# Patient Record
Sex: Male | Born: 1970 | Race: White | Hispanic: No | Marital: Married
Health system: Northeastern US, Academic
[De-identification: ages and names within clinical notes are randomized; demographics above are authoritative.]

## PROBLEM LIST (undated history)

## (undated) DIAGNOSIS — R519 Headache, unspecified: Secondary | ICD-10-CM

---

## 2017-10-26 ENCOUNTER — Ambulatory Visit

## 2019-03-07 ENCOUNTER — Ambulatory Visit

## 2019-04-23 ENCOUNTER — Ambulatory Visit

## 2019-07-17 ENCOUNTER — Ambulatory Visit

## 2020-06-23 ENCOUNTER — Ambulatory Visit

## 2020-06-23 NOTE — Telephone Encounter (Signed)
 Phone Note -       Initial call taken by: Renae Fickle,  June 23, 2020 11:42 AM  Initial Details of Call:  lvm with pt to confirm new pt appt with [facility] on 7/26

## 2020-06-23 NOTE — Telephone Encounter (Signed)
 Phone Note -     Patient    Routine    Initial call taken by: Virgina Norfolk ([facility]),  June 23, 2020 11:52 AM  Initial Details of Call:  Patient returned Hannah's call stating he was unaware of a GI appt and did not make an appt with G[facility] for 07/03/20 at 9AM.  Patient states new address is [address] [place] [address]    Patient called to cancel their appointment for: 9AM with Dr. Dollene Cleveland  Patient declined to reschedule.  Best call back number is: [phone]

## 2020-11-23 DIAGNOSIS — G4733 Obstructive sleep apnea (adult) (pediatric): Secondary | ICD-10-CM | POA: Diagnosis present

## 2020-11-23 DIAGNOSIS — G43909 Migraine, unspecified, not intractable, without status migrainosus: Secondary | ICD-10-CM | POA: Diagnosis present

## 2021-12-11 ENCOUNTER — Other Ambulatory Visit: Payer: Self-pay

## 2021-12-11 ENCOUNTER — Ambulatory Visit
Admission: EM | Admit: 2021-12-11 | Discharge: 2021-12-11 | Disposition: A | Discharge status: Home / Self Care | Payer: BC Managed Care – PPO

## 2021-12-11 DIAGNOSIS — R5383 Other fatigue: Secondary | ICD-10-CM

## 2021-12-11 DIAGNOSIS — R509 Fever, unspecified: Secondary | ICD-10-CM | POA: Diagnosis not present

## 2021-12-11 HISTORY — DX: Headache, unspecified: R51.9

## 2021-12-11 NOTE — ED Triage Notes (Addendum)
Pt states he got sick Dec 22nd. Had subjective fever. Then progressed to heartburn for a couple days which resolved, then a sore throat which resolved. States the subjective fever has remained. States he feels warm around 1-2 p.m and again in the evening. Last night was the first time he measured his temp and it was 99.9

## 2021-12-11 NOTE — ED Provider Notes (Signed)
[facility]    CSN: [id number] Arrival date & time: 12/11/21  1125      History   Chief Complaint Chief Complaint  Patient presents with   Fever    HPI Richard West is a 51 y.o. male presenting for approximately 1.5-week history of feeling unwell.  Patient reports symptoms initially included feeling overheated multiple times throughout the day.  Now he says it only happens a couple times throughout the day.  He believes these episodes where he feels very hot are associated with fever but he has not recorded a temperature until last night when it was 99.9 degrees.  He reports taking ibuprofen and Tylenol and says he feels better.  Patient says at 1 point he had a sore throat for couple of days.  He says that he had a runny nose for 1 day and reports coughing up sputum once.  States he feels a little short of breath at times but nothing significant.  Denies body aches.  Has been fatigued.  No chest pain, vomiting or diarrhea.  Patient says his daughter was sick with similar symptoms.  He has not been seen for symptoms until now.  Reports that he feels better from onset but still does not feel like himself.  No other complaints.  HPI  Past Medical History:  Diagnosis Date   Headache     There are no problems to display for this patient.   History reviewed. No pertinent surgical history.     Home Medications    Prior to Admission medications   Medication Sig Start Date End Date Taking? Authorizing Provider  metoprolol succinate (TOPROL-XL) 50 MG 24 hr tablet metoprolol succinate ER 50 mg tablet,extended release 24 hr  TAKE 2 TABLETS ONCE DAILY 10/24/17  Yes [provider]  Multiple Vitamin (MULTIVITAMIN WITH MINERALS) TABS tablet Take 1 tablet by mouth daily.   Yes [provider]    Family History History reviewed. No pertinent family history.  Social History Social History   Tobacco Use   Smoking status: Never   Smokeless tobacco: Never   Vaping Use   Vaping Use: Never used  Substance Use Topics   Alcohol use: Yes    Comment: social     Allergies   Patient has no known allergies.   Review of Systems Review of Systems  Constitutional:  Positive for fatigue and fever (subjective).  HENT:  Negative for congestion, rhinorrhea, sinus pressure, sinus pain and sore throat.   Respiratory:  Negative for cough and shortness of breath.   Cardiovascular:  Negative for chest pain.  Gastrointestinal:  Negative for abdominal pain, diarrhea, nausea and vomiting.  Genitourinary:  Negative for dysuria.  Musculoskeletal:  Negative for myalgias.  Neurological:  Negative for weakness, light-headedness and headaches.  Hematological:  Negative for adenopathy.    Physical Exam Triage Vital Signs ED Triage Vitals  Enc Vitals Group     BP 12/11/21 1241 (!) 149/71     Pulse Rate 12/11/21 1241 (!) 59     Resp 12/11/21 1241 17     Temp 12/11/21 1241 98.2 F (36.8 C)     Temp Source 12/11/21 1241 Oral     SpO2 12/11/21 1241 98 %     Weight 12/11/21 1243 255 lb (115.7 kg)     Height 12/11/21 1243 5\' 11"  (1.803 m)     Head Circumference --      Peak Flow --      Pain Score 12/11/21 1240 0  Pain Loc --      Pain Edu? --      Excl. in [place]? --    No data found.  Updated Vital Signs BP (!) 149/71 (BP Location: Right Arm)    Pulse (!) 59    Temp 98.2 F (36.8 C) (Oral)    Resp 17    Ht 5\' 11"  (1.803 m)    Wt 255 lb (115.7 kg)    SpO2 98%    BMI 35.57 kg/m      Physical Exam Vitals and nursing note reviewed.  Constitutional:      General: He is not in acute distress.    Appearance: Normal appearance. He is well-developed. He is not ill-appearing.  HENT:     Head: Normocephalic and atraumatic.     Nose: Nose normal.     Mouth/Throat:     Mouth: Mucous membranes are moist.     Pharynx: Oropharynx is clear. Posterior oropharyngeal erythema (mild) present.  Eyes:     General: No scleral icterus.    Conjunctiva/sclera:  Conjunctivae normal.  Cardiovascular:     Rate and Rhythm: Normal rate and regular rhythm.     Heart sounds: Normal heart sounds.  Pulmonary:     Effort: Pulmonary effort is normal. No respiratory distress.     Breath sounds: Normal breath sounds.  Musculoskeletal:     Cervical back: Neck supple.  Skin:    General: Skin is warm and dry.     Capillary Refill: Capillary refill takes less than 2 seconds.  Neurological:     General: No focal deficit present.     Mental Status: He is alert. Mental status is at baseline.     Motor: No weakness.     Coordination: Coordination normal.     Gait: Gait normal.  Psychiatric:        Mood and Affect: Mood normal.        Behavior: Behavior normal.        Thought Content: Thought content normal.     UC Treatments / Results  Labs (all labs ordered are listed, but only abnormal results are displayed) Labs Reviewed - No data to display  EKG   Radiology No results found.  Procedures Procedures (including critical care time)  Medications Ordered in UC Medications - No data to display  Initial Impression / Assessment and Plan / UC Course  I have reviewed the triage vital signs and the nursing notes.  Pertinent labs & imaging results that were available during my care of the patient were reviewed by me and considered in my medical decision making (see chart for details).  51 year old presenting for 1.5-week history of feeling unwell.  Reports hot flashes, getting overheated and fatigue.  Reports symptoms of sore throat, cough and congestion which have resolved.  Vitals all stable.  He is afebrile.  Overall well-appearing.  Mild posterior pharyngeal erythema, otherwise normal exam.  Advised patient that if he wanted to do some work-up we could do so to include strep, COVID-19 test and chest x-ray.  Patient declines.  He seems reassured by his exam findings.  Suspect patient probably has the flu or COVID or another viral illness and  symptoms are still lingering.  Patient would like to go home and continue to treat symptoms supportively.  He will return if symptoms are still ongoing after a week or if symptoms worsen.  ER precautions reviewed.   Final Clinical Impressions(s) / UC Diagnoses   Final diagnoses:  Subjective  fever  Other fatigue     Discharge Instructions      -Your symptoms are likely due to a viral illness.  Could have been the flu.  Related to test and it would not change management. - We did discuss testing for strep, performing a chest x-ray and checking for COVID given your continued symptoms but you have declined at this time. - Would advise you to increase rest and fluids and continue with Tylenol Motrin if you feel fevers. - If you develop a cough, chest pain, increased breathing difficulty, increased weakness, more diarrhea, have return of sore throat or you are not feeling better in another week, you need to be seen again and have some work-up performed.     ED Prescriptions   None    PDMP not reviewed this encounter.   Danton Clap, PA-C 12/11/21 1408

## 2021-12-11 NOTE — Discharge Instructions (Signed)
-  Your symptoms are likely due to a viral illness.  Could have been the flu.  Related to test and it would not change management. - We did discuss testing for strep, performing a chest x-ray and checking for COVID given your continued symptoms but you have declined at this time. - Would advise you to increase rest and fluids and continue with Tylenol Motrin if you feel fevers. - If you develop a cough, chest pain, increased breathing difficulty, increased weakness, more diarrhea, have return of sore throat or you are not feeling better in another week, you need to be seen again and have some work-up performed.

## 2021-12-12 ENCOUNTER — Emergency Department: Payer: BC Managed Care – PPO

## 2021-12-12 ENCOUNTER — Observation Stay
Admission: EM | Admit: 2021-12-12 | Discharge: 2021-12-13 | Disposition: A | Discharge status: Home / Self Care | Payer: BC Managed Care – PPO | Attending: Internal Medicine | Admitting: Internal Medicine

## 2021-12-12 ENCOUNTER — Encounter: Payer: Self-pay | Admitting: Emergency Medicine

## 2021-12-12 ENCOUNTER — Observation Stay: Payer: BC Managed Care – PPO

## 2021-12-12 ENCOUNTER — Other Ambulatory Visit: Payer: Self-pay

## 2021-12-12 DIAGNOSIS — Z20822 Contact with and (suspected) exposure to covid-19: Secondary | ICD-10-CM | POA: Insufficient documentation

## 2021-12-12 DIAGNOSIS — R200 Anesthesia of skin: Secondary | ICD-10-CM

## 2021-12-12 DIAGNOSIS — G43719 Chronic migraine without aura, intractable, without status migrainosus: Secondary | ICD-10-CM

## 2021-12-12 DIAGNOSIS — I639 Cerebral infarction, unspecified: Secondary | ICD-10-CM

## 2021-12-12 DIAGNOSIS — I10 Essential (primary) hypertension: Secondary | ICD-10-CM

## 2021-12-12 DIAGNOSIS — R7301 Impaired fasting glucose: Secondary | ICD-10-CM

## 2021-12-12 DIAGNOSIS — G43909 Migraine, unspecified, not intractable, without status migrainosus: Secondary | ICD-10-CM | POA: Diagnosis present

## 2021-12-12 DIAGNOSIS — R21 Rash and other nonspecific skin eruption: Secondary | ICD-10-CM

## 2021-12-12 DIAGNOSIS — R29818 Other symptoms and signs involving the nervous system: Secondary | ICD-10-CM | POA: Diagnosis not present

## 2021-12-12 DIAGNOSIS — G4733 Obstructive sleep apnea (adult) (pediatric): Secondary | ICD-10-CM | POA: Diagnosis present

## 2021-12-12 DIAGNOSIS — G459 Transient cerebral ischemic attack, unspecified: Secondary | ICD-10-CM

## 2021-12-12 DIAGNOSIS — Z79899 Other long term (current) drug therapy: Secondary | ICD-10-CM | POA: Diagnosis not present

## 2021-12-12 LAB — URINALYSIS, COMPLETE (UACMP) WITH MICROSCOPIC
Bacteria, UA: NONE SEEN
Bilirubin Urine: NEGATIVE
Glucose, UA: NEGATIVE mg/dL
Hgb urine dipstick: NEGATIVE
Ketones, ur: NEGATIVE mg/dL
Leukocytes,Ua: NEGATIVE
Nitrite: NEGATIVE
Protein, ur: NEGATIVE mg/dL
Specific Gravity, Urine: 1.02 (ref 1.005–1.030)
Squamous Epithelial / HPF: NONE SEEN (ref 0–5)
pH: 7 (ref 5.0–8.0)

## 2021-12-12 LAB — URINE DRUG SCREEN, QUALITATIVE (ARMC ONLY)
Amphetamines, Ur Screen: NOT DETECTED
Barbiturates, Ur Screen: NOT DETECTED
Benzodiazepine, Ur Scrn: NOT DETECTED
Cannabinoid 50 Ng, Ur ~~LOC~~: NOT DETECTED
Cocaine Metabolite,Ur ~~LOC~~: NOT DETECTED
MDMA (Ecstasy)Ur Screen: NOT DETECTED
Methadone Scn, Ur: NOT DETECTED
Opiate, Ur Screen: NOT DETECTED
Phencyclidine (PCP) Ur S: NOT DETECTED
Tricyclic, Ur Screen: NOT DETECTED

## 2021-12-12 LAB — CBC
HCT: 48.3 % (ref 39.0–52.0)
Hemoglobin: 16.7 g/dL (ref 13.0–17.0)
MCH: 34.6 pg — ABNORMAL HIGH (ref 26.0–34.0)
MCHC: 34.6 g/dL (ref 30.0–36.0)
MCV: 100 fL (ref 80.0–100.0)
Platelets: 290 10*3/uL (ref 150–400)
RBC: 4.83 MIL/uL (ref 4.22–5.81)
RDW: 12 % (ref 11.5–15.5)
WBC: 13.3 10*3/uL — ABNORMAL HIGH (ref 4.0–10.5)
nRBC: 0 % (ref 0.0–0.2)

## 2021-12-12 LAB — DIFFERENTIAL
Abs Immature Granulocytes: 0.11 10*3/uL — ABNORMAL HIGH (ref 0.00–0.07)
Basophils Absolute: 0.1 10*3/uL (ref 0.0–0.1)
Basophils Relative: 1 %
Eosinophils Absolute: 0.3 10*3/uL (ref 0.0–0.5)
Eosinophils Relative: 2 %
Immature Granulocytes: 1 %
Lymphocytes Relative: 20 %
Lymphs Abs: 2.6 10*3/uL (ref 0.7–4.0)
Monocytes Absolute: 0.9 10*3/uL (ref 0.1–1.0)
Monocytes Relative: 6 %
Neutro Abs: 9.4 10*3/uL — ABNORMAL HIGH (ref 1.7–7.7)
Neutrophils Relative %: 70 %

## 2021-12-12 LAB — COMPREHENSIVE METABOLIC PANEL
ALT: 34 U/L (ref 0–44)
AST: 30 U/L (ref 15–41)
Albumin: 3.7 g/dL (ref 3.5–5.0)
Alkaline Phosphatase: 55 U/L (ref 38–126)
Anion gap: 11 (ref 5–15)
BUN: 19 mg/dL (ref 6–20)
CO2: 27 mmol/L (ref 22–32)
Calcium: 9.1 mg/dL (ref 8.9–10.3)
Chloride: 101 mmol/L (ref 98–111)
Creatinine, Ser: 1.01 mg/dL (ref 0.61–1.24)
GFR, Estimated: >60 mL/min (ref >60)
Glucose, Bld: 147 mg/dL — ABNORMAL HIGH (ref 70–99)
Potassium: 3.7 mmol/L (ref 3.5–5.1)
Sodium: 139 mmol/L (ref 135–145)
Total Bilirubin: 0.7 mg/dL (ref 0.3–1.2)
Total Protein: 8 g/dL (ref 6.5–8.1)

## 2021-12-12 LAB — CBG MONITORING, ED: Glucose-Capillary: 152 mg/dL — ABNORMAL HIGH (ref 70–99)

## 2021-12-12 LAB — APTT: aPTT: 37 seconds — ABNORMAL HIGH (ref 24–36)

## 2021-12-12 LAB — PROTIME-INR
INR: 1.1 (ref 0.8–1.2)
Prothrombin Time: 14.2 seconds (ref 11.4–15.2)

## 2021-12-12 LAB — RESP PANEL BY RT-PCR (FLU A&B, COVID) ARPGX2
Influenza A by PCR: NEGATIVE
Influenza B by PCR: NEGATIVE
SARS Coronavirus 2 by RT PCR: NEGATIVE

## 2021-12-12 MED ORDER — ACETAMINOPHEN 325 MG PO TABS: 650.0000 mg | ORAL_TABLET | ORAL | Status: DC | PRN

## 2021-12-12 MED ORDER — ENOXAPARIN SODIUM 40 MG/0.4ML IJ SOSY
40.0000 mg | PREFILLED_SYRINGE | INTRAMUSCULAR | Status: DC
  Administered 2021-12-12: 40 mg via SUBCUTANEOUS
  Filled 2021-12-12: qty 0.4

## 2021-12-12 MED ORDER — ACETAMINOPHEN 160 MG/5ML PO SOLN
650.0000 mg | ORAL | Status: DC | PRN
  Filled 2021-12-12: qty 20.3

## 2021-12-12 MED ORDER — STROKE: EARLY STAGES OF RECOVERY BOOK: Freq: Once | Status: DC

## 2021-12-12 MED ORDER — ACETAMINOPHEN 650 MG RE SUPP: 650.0000 mg | RECTAL | Status: DC | PRN

## 2021-12-12 MED ORDER — SODIUM CHLORIDE 0.9% FLUSH
3.0000 mL | Freq: Once | INTRAVENOUS | Status: AC
  Administered 2021-12-12: 3 mL via INTRAVENOUS

## 2021-12-12 NOTE — ED Notes (Signed)
Pt to MRI

## 2021-12-12 NOTE — ED Provider Notes (Signed)
[facility] Provider Note    Event Date/Time   First MD Initiated Contact with Patient 12/12/21 2001     (approximate)   History   Code Stroke   HPI  Richard West is a 51 y.o. male who according to urgent care note dated from yesterday had been complaining of feeling unwell for roughly 1.5 weeks, presents to the emergency department because of left facial numbness. States he also had some difficulty with swallowing. Symptoms started roughly 1 hour prior to arrival. Additional history was obtained from family who stated they had noticed perhaps some slight change in his speech. The patient denies any acute weakness of his arms or legs. No vision changes. States he has history of headaches.     Physical Exam   Triage Vital Signs: ED Triage Vitals  Enc Vitals Group     BP 12/12/21 1935 (!) 163/85     Pulse Rate 12/12/21 1935 78     Resp 12/12/21 1935 18     Temp 12/12/21 1935 98.7 F (37.1 C)     Temp src --      SpO2 12/12/21 1935 96 %     Weight 12/12/21 1936 255 lb 1.2 oz (115.7 kg)     Height 12/12/21 1936 5\' 11"  (1.803 m)     Head Circumference --      Peak Flow --      Pain Score 12/12/21 1936 0   Most recent vital signs: Vitals:   12/12/21 2002 12/12/21 2018  BP: (!) 161/90 140/70  Pulse: 70 83  Resp: 20 (!) 26  Temp:    SpO2: 97% 99%    General: Awake, no distress.  CV:  Good peripheral perfusion.  Resp:  Normal effort.  Abd:  No distention.  Neuro:  Face symmetric. Tongue midline.     ED Results / Procedures / Treatments   Labs (all labs ordered are listed, but only abnormal results are displayed) Labs Reviewed  CBC - Abnormal; Notable for the following components:      Result Value   WBC 13.3 (*)    MCH 34.6 (*)    All other components within normal limits  DIFFERENTIAL - Abnormal; Notable for the following components:   Neutro Abs 9.4 (*)    Abs Immature Granulocytes 0.11 (*)    All other components within normal  limits  COMPREHENSIVE METABOLIC PANEL - Abnormal; Notable for the following components:   Glucose, Bld 147 (*)    All other components within normal limits  CBG MONITORING, ED - Abnormal; Notable for the following components:   Glucose-Capillary 152 (*)    All other components within normal limits  PROTIME-INR  APTT     EKG  I, Phineas Semen, attending physician, personally viewed and interpreted this EKG  EKG Time: 2000 Rate: 80 Rhythm: sinus rhythm Axis: right axis deviation Intervals: qtc 463 QRS: narrow ST changes: no st elevation Impression: abnormal ekg  RADIOLOGY CT head My interpretation: No bleed, no mass Radiology interpretation:  IMPRESSION:  1. Negative head CT.  No acute intracranial abnormality.  2. ASPECTS is 10.  3. Mild Chiari 1 malformation.      I, Phineas Semen, personally discussed these images and results by phone with the on-call radiologist and used this discussion as part of my medical decision making.     PROCEDURES:  Critical Care performed: No  Procedures   MEDICATIONS ORDERED IN ED: Medications  sodium chloride flush (NS) 0.9 % injection  3 mL (3 mLs Intravenous Given 12/12/21 1959)     IMPRESSION / MDM / ASSESSMENT AND PLAN / ED COURSE  I reviewed the triage vital signs and the nursing notes.                              Differential diagnosis includes, but is not limited to, Bell's palsy, complex migraine, CVA.  Patient presented to the emergency department today because of concerns for left facial numbness and some difficulty swallowing with potentially intermittent slurred speech.  Symptoms have been present for roughly 1 hour prior to arrival.  Patient was called a code stroke.  Neurologist did evaluate the patient did not feel he was a antithrombotic candidate at this time however does recommend admission for stroke/TIA work-up.      FINAL CLINICAL IMPRESSION(S) / ED DIAGNOSES   Final diagnoses:  Left facial  numbness     Note:  This document was prepared using Dragon voice recognition software and may include unintentional dictation errors.    Nance Pear, MD 12/13/21 [phone]

## 2021-12-12 NOTE — ED Notes (Addendum)
Pt back from CT - Tele neuro @ the bedside.

## 2021-12-12 NOTE — ED Notes (Signed)
Pt removed from t-shirt, jeans, and sneakers and placed into a hospital gown - his clothing was placed into belongings bags and given to his wife.

## 2021-12-12 NOTE — Consult Note (Signed)
[facility] TeleNeurology Consult Services   Patient Name:   Richard West, Richard West Date of Birth:   [birth year] Identification Number:   MRN - [id number] Date of Service:   12/12/2021 20:01:52  Diagnosis:       R29.810 - Facial numbness/ Facial weakness  Impression:      Patient presents to the hospital for transient left facial numbness/weakness and dysarthria which appears to have resolved. No focal disabling neurological deficits to warrant IV thrombolytics at this time. Differential includes migrainous phenomenon versus toxic metabolic etiology versus stroke/TIA. I would recommend he be admitted for TIA work-up. Neuro follow-up.  Metrics: Last Known Well: 12/12/2021 18:30:00 [facility] Notification Time: 12/12/2021 20:01:52 Arrival Time: 12/12/2021 19:27:00 Stamp Time: 12/12/2021 20:01:52 Initial Response Time: 12/12/2021 20:04:53 Symptoms: Facial numbness. NIHSS Start Assessment Time: 12/12/2021 20:07:48 Patient is not a candidate for Thrombolytic. Thrombolytic Medical Decision: 12/12/2021 20:07:47 Patient was not deemed candidate for Thrombolytic because of following reasons: No disabling symptoms.  I personally Reviewed the CT Head and it Showed no acute abnormalities  ED Physician notified of diagnostic impression and management plan on 12/12/2021 20:14:37  Advanced Imaging: Advanced Imaging Not Completed because:  Symptoms not consistent with LVO, NIHSS <6   Our recommendations are outlined below.  Recommendations:        Stroke/Telemetry Floor       Neuro Checks       Bedside Swallow Eval       DVT Prophylaxis       IV Fluids, Normal Saline       Head of Bed 30 Degrees       Euglycemia and Avoid Hyperthermia (PRN Acetaminophen)       Toxic/metabolic/infx workup per ED including UA, UDS       MRI brain without IV contrast       If no cerebral blood vessel imaging done in ED (such as CTA), recommend MRA head/neck without contrast, or carotid  ultrasound if cannot obtain MRA       TSH, A1c, lipid profile       Transthoracic echocardiogram       Continuous telemetry       Physical, occupational, and speech therapies       q4h neuro checks/NIHSS       NPO until bedside swallow       Neurology follow-up  Routine Consultation with [facility] for Follow up Care  Sign Out:       Discussed with Emergency Department Provider    ------------------------------------------------------------------------------  History of Present Illness: Patient is a 51 year old Male.  Patient was brought by private transportation with symptoms of Facial numbness. 52 year old male history of chronic migraine, hypertension, presents to the hospital for left facial numbness. Has been suffering from flulike symptoms for about a month now. Has been having some myalgias. Today was eating dinner and he began having some numbness on the left side of his face as well as in his throat. There was also concern for possible left facial droop and some dysarthria. His symptoms have essentially resolved at this time other than some mild tingling around the left side of the face near the mouth. No other deficits or symptoms. Never had a stroke or TIA before. Is having a headache although he has daily headaches from his chronic migraine. On exam he has no focal deficits right now, NIH stroke scale of 0.   Past Medical History:      Hypertension [place] PMH:  chronic migraine  Medications:  No Anticoagulant use  No Antiplatelet use Reviewed EMR for current medications  Allergies:  NKDA  Social History: Smoking: No Alcohol Use: No Drug Use: No  Family History:  There is no family history of premature cerebrovascular disease pertinent to this consultation  ROS : 14 Points Review of Systems was performed and was negative except mentioned in HPI.  Past Surgical History: There Is No Surgical History Contributory To Todays  Visit    Examination: BP(161/90), Pulse(69), Blood Glucose(152) 1A: Level of Consciousness - Alert; keenly responsive + 0 1B: Ask Month and Age - Both Questions Right + 0 1C: Blink Eyes & Squeeze Hands - Performs Both Tasks + 0 2: Test Horizontal Extraocular Movements - Normal + 0 3: Test Visual Fields - No Visual Loss + 0 4: Test Facial Palsy (Use Grimace if Obtunded) - Normal symmetry + 0 5A: Test Left Arm Motor Drift - No Drift for 10 Seconds + 0 5B: Test Right Arm Motor Drift - No Drift for 10 Seconds + 0 6A: Test Left Leg Motor Drift - No Drift for 5 Seconds + 0 6B: Test Right Leg Motor Drift - No Drift for 5 Seconds + 0 7: Test Limb Ataxia (FNF/Heel-Shin) - No Ataxia + 0 8: Test Sensation - Normal; No sensory loss + 0 9: Test Language/Aphasia - Normal; No aphasia + 0 10: Test Dysarthria - Normal + 0 11: Test Extinction/Inattention - No abnormality + 0  NIHSS Score: 0   Pre-Morbid Modified Rankin Scale: 0 Points = No symptoms at all   Patient/Family was informed the Neurology Consult would occur via TeleHealth consult by way of interactive audio and video telecommunications and consented to receiving care in this manner.   Patient is being evaluated for possible acute neurologic impairment and high probability of imminent or life-threatening deterioration. I spent total of 35 minutes providing care to this patient, including time for face to face visit via telemedicine, review of medical records, imaging studies and discussion of findings with providers, the patient and/or family.   Dr Knox Royalty   [facility] [phone]   Case [id number]

## 2021-12-12 NOTE — ED Triage Notes (Addendum)
Pt c/o left sided jaw numbness and his swallowing feels different that started about one hour ago. Pt also c/o BLE numbness x 1 week

## 2021-12-12 NOTE — H&P (Signed)
History and Physical    Richard West MVH:[id number] DOB: 07/27/1971 DOA: 12/12/2021  PCP: Pcp, No   Patient coming from: home  I have personally briefly reviewed patient's relevant [facility]  Chief Complaint: facial numbness  HPI: Richard Pickerric Derner is a 51 y.o. male with medical history significant for Chronic migraine who presented to the emergency room as a code stroke within 1 hour of the onset of left-sided facial numbness and weakness, dysarthria, trouble swallowing and slurred speech.  He denies any weakness or tingling in the extremities.  His symptoms had already started resolving by arrival with NIHSS of 0.  The episode was preceded by a 2-week history of flulike symptoms with low-grade temperature, sore throat and runny nose and a mild cough.  His daughter was also ill with similar symptoms.  He denies chest pain or shortness of breath.  States he noticed a rash on his left forearm that developed earlier in the day that does not itch or burn.  ED course: On arrival BP 163/85 with otherwise normal vitals Blood work with mild leukocytosis of 13,000 otherwise unremarkable  Head CT code stroke: Negative CT.  Mild Chiari I malformation  EKG, personally viewed and interpreted: Sinus rhythm at 80 with no acute ST-T wave changes  Teleneurology assessment: Differential includes migrainous phenomenon VS toxic metabolic etiology VS stroke/TIA.  Recommended admission for TIA work-up with neuro follow-up  Hospitalist consulted for admission.   Assessment/Plan    Transient neurologic deficit - Per teleneurology: Differential includes migrainous phenomenon VS toxic metabolic etiology VS stroke/TIA -Improving symptoms since onset but not tPA candidate due to low NIHSS - Stroke work-up to include MRI, continuous cardiac monitoring, echocardiogram and carotid Doppler - Bedside swallow eval, neurochecks - Toxic/metabolic/infection work-up to include UA, UDS, CXR, covid  19 and flu PCR - Follow TSH, A1c, lipid profile - PT OT and speech therapy eval - Neurology consult  Rash left forearm - Unclear etiology, does not appear shingles-like - Continue to monitor  Chronic migraine not intractable - Sumatriptan as needed - Patient gets Botox shots quarterly    Obstructive sleep apnea - CPAP if desired   DVT prophylaxis: Lovenox  Code Status: full code  Family Communication:  none  Disposition Plan: Back to previous home environment Consults called: none  Status: Observation   Review of Systems: As per HPI otherwise all other systems on review of systems negative.   Physical Exam: Vitals:   12/12/21 2018 12/12/21 2030 12/12/21 2100 12/12/21 2115  BP: 140/70 (!) 127/55 117/63 117/68  Pulse: 83 66 66 61  Resp: (!) 26 20 20  (!) 23  Temp:      SpO2: 99% 95% 96% 98%  Weight:      Height:       Constitutional: Alert, oriented x 3 . Not in any apparent distress HEENT:      Head: Normocephalic and atraumatic.         Eyes: PERLA, EOMI, Conjunctivae are normal. Sclera is non-icteric.       Mouth/Throat: Mucous membranes are moist.       Neck: Supple with no signs of meningismus. Cardiovascular: Regular rate and rhythm. No murmurs, gallops, or rubs. 2+ symmetrical distal pulses are present . No JVD. No  LE edema Respiratory: Respiratory effort normal .Lungs sounds clear bilaterally. No wheezes, crackles, or rhonchi.  Gastrointestinal: Soft, non tender, non distended. Positive bowel sounds.  Genitourinary: No CVA tenderness. Musculoskeletal: Nontender with normal range of motion in all  extremities. No cyanosis, or erythema of extremities. Neurologic:  Face is symmetric. Moving all extremities. No gross focal neurologic deficits . Skin: few papular lesions on the  left forearm dorsal aspect Psychiatric: Mood and affect are appropriate     Past Medical History:  Diagnosis Date   Headache     History reviewed. No pertinent surgical history.    reports that he has never smoked. He has never used smokeless tobacco. He reports current alcohol use. No history on file for drug use.  No Known Allergies  No family history on file.    Prior to Admission medications   Medication Sig Start Date End Date Taking? Authorizing Provider  metoprolol succinate (TOPROL-XL) 50 MG 24 hr tablet 100 mg daily. 10/24/17  Yes [provider]  Butalbital-Acetaminophen 50-300 MG CAPS Take 1 capsule by mouth as directed.    [provider]  ibuprofen (ADVIL) 800 MG tablet Take 800 mg by mouth every 8 (eight) hours as needed for pain.    [provider]  Multiple Vitamin (MULTIVITAMIN WITH MINERALS) TABS tablet Take 1 tablet by mouth daily.    [provider]      Labs on Admission: I have personally reviewed following labs and imaging studies  CBC: Recent Labs  Lab 12/12/21 1945  WBC 13.3*  NEUTROABS 9.4*  HGB 16.7  HCT 48.3  MCV 100.0  PLT [id number]   Basic Metabolic Panel: Recent Labs  Lab 12/12/21 1945  NA 139  K 3.7  CL 101  CO2 27  GLUCOSE 147*  BUN 19  CREATININE 1.01  CALCIUM 9.1   GFR: Estimated Creatinine Clearance: 112 mL/min (by C-G formula based on SCr of 1.01 mg/dL). Liver Function Tests: Recent Labs  Lab 12/12/21 1945  AST 30  ALT 34  ALKPHOS 55  BILITOT 0.7  PROT 8.0  ALBUMIN 3.7   No results for input(s): LIPASE, AMYLASE in the last 168 hours. No results for input(s): AMMONIA in the last 168 hours. Coagulation Profile: Recent Labs  Lab 12/12/21 1945  INR 1.1   Cardiac Enzymes: No results for input(s): CKTOTAL, CKMB, CKMBINDEX, TROPONINI in the last 168 hours. BNP (last 3 results) No results for input(s): PROBNP in the last 8760 hours. HbA1C: No results for input(s): HGBA1C in the last 72 hours. CBG: Recent Labs  Lab 12/12/21 1957  GLUCAP 152*   Lipid Profile: No results for input(s): CHOL, HDL, LDLCALC, TRIG, CHOLHDL, LDLDIRECT in the last 72 hours. Thyroid  Function Tests: No results for input(s): TSH, T4TOTAL, FREET4, T3FREE, THYROIDAB in the last 72 hours. Anemia Panel: No results for input(s): VITAMINB12, FOLATE, FERRITIN, TIBC, IRON, RETICCTPCT in the last 72 hours. Urine analysis: No results found for: COLORURINE, APPEARANCEUR, LABSPEC, [place], GLUCOSEU, [place], BILIRUBINUR, KETONESUR, PROTEINUR, UROBILINOGEN, NITRITE, LEUKOCYTESUR  Radiological Exams on Admission: CT HEAD CODE STROKE WO CONTRAST  Result Date: 12/12/2021 CLINICAL DATA:  Code stroke.  No deficit, stroke suspected. EXAM: CT HEAD WITHOUT CONTRAST TECHNIQUE: Contiguous axial images were obtained from the base of the skull through the vertex without intravenous contrast. COMPARISON:  None. FINDINGS: Brain: Cerebral volume within normal limits for patient age. No evidence for acute intracranial hemorrhage. No findings to suggest acute large vessel territory infarct. No mass lesion, midline shift, or mass effect. Ventricles are normal in size without evidence for hydrocephalus. No extra-axial fluid collection identified. Probable mild Chiari malformation with the cerebellar tonsils extending up to 9 mm below the foramen magnum on the right. Vascular: No hyperdense vessel identified.  Skull: Scalp soft tissues demonstrate no acute abnormality. Calvarium intact. Sinuses/Orbits: Globes and orbital soft tissues within normal limits. Visualized paranasal sinuses are clear. No mastoid effusion. ASPECTS [facility] Stroke Program Early CT Score) - Ganglionic level infarction (caudate, lentiform nuclei, internal capsule, insula, M1-M3 cortex): 7 - Supraganglionic infarction (M4-M6 cortex): 3 Total score (0-10 with 10 being normal): 10 IMPRESSION: 1. Negative head CT.  No acute intracranial abnormality. 2. ASPECTS is 10. 3. Mild Chiari 1 malformation. Critical Value/emergent results were called by telephone at the time of interpretation on 12/12/2021 at 8:07 pm to provider [facility] , who verbally  acknowledged these results. Electronically Signed   By: Jeannine Boga M.D.   On: 12/12/2021 20:10       Athena Masse MD [facility]   12/12/2021, 9:47 PM

## 2021-12-12 NOTE — ED Notes (Signed)
Pt provided a phone to speak with MRI. 

## 2021-12-13 ENCOUNTER — Other Ambulatory Visit: Payer: Self-pay

## 2021-12-13 ENCOUNTER — Observation Stay: Payer: BC Managed Care – PPO

## 2021-12-13 DIAGNOSIS — R7301 Impaired fasting glucose: Secondary | ICD-10-CM

## 2021-12-13 DIAGNOSIS — G459 Transient cerebral ischemic attack, unspecified: Secondary | ICD-10-CM

## 2021-12-13 DIAGNOSIS — G4733 Obstructive sleep apnea (adult) (pediatric): Secondary | ICD-10-CM

## 2021-12-13 DIAGNOSIS — R21 Rash and other nonspecific skin eruption: Secondary | ICD-10-CM

## 2021-12-13 DIAGNOSIS — R29818 Other symptoms and signs involving the nervous system: Principal | ICD-10-CM

## 2021-12-13 DIAGNOSIS — R200 Anesthesia of skin: Secondary | ICD-10-CM

## 2021-12-13 DIAGNOSIS — I10 Essential (primary) hypertension: Secondary | ICD-10-CM

## 2021-12-13 DIAGNOSIS — G43719 Chronic migraine without aura, intractable, without status migrainosus: Secondary | ICD-10-CM

## 2021-12-13 LAB — HEMOGLOBIN A1C
Hgb A1c MFr Bld: 5.9 % — ABNORMAL HIGH (ref 4.8–5.6)
Mean Plasma Glucose: 122.63 mg/dL

## 2021-12-13 LAB — LIPID PANEL
Cholesterol: 166 mg/dL (ref 0–200)
HDL: 27 mg/dL — ABNORMAL LOW (ref >40)
LDL Cholesterol: 108 mg/dL — ABNORMAL HIGH (ref 0–99)
Total CHOL/HDL Ratio: 6.1 RATIO
Triglycerides: 155 mg/dL — ABNORMAL HIGH (ref <150)
VLDL: 31 mg/dL (ref 0–40)

## 2021-12-13 LAB — HIV ANTIBODY (ROUTINE TESTING W REFLEX): HIV Screen 4th Generation wRfx: NONREACTIVE

## 2021-12-13 MED ORDER — ATORVASTATIN CALCIUM 20 MG PO TABS
40.0000 mg | ORAL_TABLET | Freq: Every day | ORAL | Status: DC
  Administered 2021-12-13: 15:00:00 40 mg via ORAL
  Filled 2021-12-13: qty 2

## 2021-12-13 MED ORDER — ATORVASTATIN CALCIUM 40 MG PO TABS: 40.0000 mg | ORAL_TABLET | Freq: Every day | ORAL | 0 refills | Status: AC

## 2021-12-13 MED ORDER — ASPIRIN EC 81 MG PO TBEC
81.0000 mg | DELAYED_RELEASE_TABLET | Freq: Every day | ORAL | Status: DC
  Administered 2021-12-13: 81 mg via ORAL
  Filled 2021-12-13: qty 1

## 2021-12-13 MED ORDER — METOPROLOL SUCCINATE ER 50 MG PO TB24
100.0000 mg | ORAL_TABLET | Freq: Every day | ORAL | Status: DC
  Filled 2021-12-13: qty 2

## 2021-12-13 MED ORDER — ASPIRIN 81 MG PO TBEC: 81.0000 mg | DELAYED_RELEASE_TABLET | Freq: Every day | ORAL | 0 refills | Status: AC

## 2021-12-13 NOTE — Progress Notes (Signed)
PT Cancellation Note  Patient Details Name: Richard West MRN: [id number] DOB: 05/03/1971   Cancelled Treatment:    Reason Eval/Treat Not Completed: PT screened, no needs identified, will sign off.  PT consult received.  Chart reviewed.  MRI of brain negative for acute intracranial abnormality.  Intact B LE sensation, tone, heel to shin coordination, strength, and proprioception.  No loss of balance with ambulation 120 feet (no AD) and head turns R/L/up/down, increasing/decreasing speed, and turning and stopping.  Pt reports no issues with walking, balance, LE strength, or LE sensation (did report soreness B thighs and calves since he carried his dog up/down spiral staircase 3 times but soreness has been improving--currently 2/10).  HR 62-69 bpm and O2 sats 95% or greater on room air during therapy screen.  No acute PT needs identified; will sign off; pt in agreement.  Hendricks Limes, PT 12/13/21, 10:38 AM

## 2021-12-13 NOTE — Progress Notes (Signed)
SLP Cancellation Note  Patient Details Name: Richard West MRN: [id number] DOB: [birth year]   Cancelled treatment:       Reason Eval/Treat Not Completed: SLP screened, no needs identified, will sign off  Chart reviewed and pt interviewed. All symptoms have resolved at this time.   Richard West M.S., CCC-SLP, [place] Office [phone]  Stormy Fabian 12/13/2021, 12:42 PM

## 2021-12-13 NOTE — Consult Note (Signed)
Neurology Consultation Reason for Consult: Facial weakness and dysarthria Referring Physician: Leslye Peer, R  CC: Right-facial weakness  History is obtained from: Patient  HPI: Richard West is a 51 y.o. male with a history of hypertension as well as migraine who presents with left facial weakness and numbness that started last night.  He states that it started abruptly around 630, and then gradually resolved over the next few hours.  By the time of his evaluation at 8 PM by teleneurology, it had resolved.  He describes it as feeling like he had gotten a shot of Novocain at the dentist, no tingling or paresthesia.  He states that he was slurring his speech.   LKW: 6:30 PM tpa given?: no, resolution of symptoms   ROS: A 14 point ROS was performed and is negative except as noted in the HPI.   Past Medical History:  Diagnosis Date   Headache   Hypertension  Family history: Sister with stroke   Social History:  reports that he has never smoked. He has never used smokeless tobacco. He reports current alcohol use. No history on file for drug use.   Exam: Current vital signs: BP (!) 152/82 (BP Location: Right Arm)    Pulse 68    Temp 98.7 F (37.1 C)    Resp 18    Ht 5\' 11"  (1.803 m)    Wt 115.7 kg    SpO2 95%    BMI 35.58 kg/m  Vital signs in last 24 hours: Temp:  [98.7 F (37.1 C)] 98.7 F (37.1 C) (01/04 1935) Pulse Rate:  [57-83] 68 (01/05 1153) Resp:  [17-26] 18 (01/05 1153) BP: (117-180)/(55-99) 152/82 (01/05 1153) SpO2:  [95 %-100 %] 95 % (01/05 1153) Weight:  [115.7 kg] 115.7 kg (01/04 1936)   Physical Exam  Constitutional: Appears well-developed and well-nourished.  Psych: Affect appropriate to situation Eyes: No scleral injection HENT: No OP obstruction MSK: no joint deformities.  Cardiovascular: Normal rate and regular rhythm.  Respiratory: Effort normal, non-labored breathing GI: Soft.  No distension. There is no tenderness.  Skin: WDI  Neuro: Mental  Status: Patient is awake, alert, oriented to person, place, month, year, and situation. Patient is able to give a clear and coherent history. No signs of aphasia or neglect Cranial Nerves: II: Visual Fields are full. Pupils are equal, round, and reactive to light.   III,IV, VI: EOMI without ptosis or diploplia.  V: Facial sensation is symmetric to temperature VII: Facial movement is symmetric.  VIII: hearing is intact to voice X: Uvula elevates symmetrically XI: Shoulder shrug is symmetric. XII: tongue is midline without atrophy or fasciculations.  Motor: Tone is normal. Bulk is normal. 5/5 strength was present in all four extremities.  Sensory: Sensation is symmetric to light touch and temperature in the arms and legs. Cerebellar: FNF and HKS are intact bilaterally   I have reviewed labs in epic and the results pertinent to this consultation are: LDL 103 A1c 5.9 UDS negative Creatinine 1.01  I have reviewed the images obtained: MRI brain-negative  Impression: 51 year old male with history of hypertension who presents with transient facial weakness.  I do think I would favor treating this as TIA at this time.  With ABCD2 of 2, I think it would be reasonable to pursue aspirin monotherapy for antiplatelets.  I would favor aggressive lipid control with a goal LDL less than 70, and will start atorvastatin 40 mg daily  Recommendations: 1) aspirin 81 mg daily 2) Lipitor 40 mg daily  3) echocardiogram 4) if no embolic source on the echo, I would continue the antiplatelet therapy and lipid management and he could be discharged from a neurological point of view.  Roland Rack, MD [facility] [phone]  If 7pm- 7am, please page neurology on call as listed in [place].

## 2021-12-13 NOTE — ED Notes (Signed)
Pt back from MRI 

## 2021-12-13 NOTE — Progress Notes (Signed)
Message sent from Dr. Mariah Milling  Needs outpt echo ordered in office  Could not be completed in the hospital,  TIA is dx  Now being d/c from hosdpital, dr. Renae Gloss requesting  We can call with results  Thx  TG   Pt seen at [facility] [place] ER, admitted as inpatient for TIA, per notes  Recommendations: 1) aspirin 81 mg daily 2) Lipitor 40 mg daily 3) echocardiogram 4) if no embolic source on the echo, I would continue the antiplatelet therapy and lipid management and he could be discharged from a neurological point of view.   Ritta Slot, MD [facility] [phone]  Order placed by Dr. Renae Gloss and in pt's chart, message sent to [facility] to schedule.

## 2021-12-13 NOTE — ED Notes (Signed)
Pt alert, tolerating po's well, tolerating PT/OT well.

## 2021-12-13 NOTE — Discharge Summary (Signed)
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  Patient coming in with left facial numbness starting around 6:30 PM and went up until 12 midnight.  Patient feels fine at this time.  He wanted to go home.  MRI of the brain was negative.  Carotid ultrasound was negative.  Echocardiogram was ordered but unable to be done on 12/13/2021.  The patient wanted to be discharged home.  I spoke with neurology again and Dr. Leonel Ramsay was okay with doing an outpatient echocardiogram since echocardiogram could not be done today.  I told him that treatment is different if there is a clot in the heart.  Patient still wanted to be discharged and follow-up as outpatient.  I did speak with Dr. Rockey Situ and his office nurse will set him up for an outpatient echocardiogram.  I prescribed aspirin and Lipitor upon disposition.  The patient will follow-up with his neurologist. 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  Follow-up with neurology as outpatient. Essential hypertension on Toprol Obstructive sleep apnea Rash left forearm has resolved Impaired fasting glucose.  Hemoglobin A1c 5.9.  Patient not a diabetic at this point.  DISCHARGE CONDITIONS:   Satisfactory  CONSULTS OBTAINED:   Neurology  DRUG ALLERGIES:  No Known Allergies  DISCHARGE MEDICATIONS:   Allergies as of 12/13/2021   No Known Allergies      Medication List     STOP taking these medications    ibuprofen 800 MG tablet Commonly known as: ADVIL       TAKE these medications    aspirin 81 MG EC tablet Take 1 tablet (81 mg total) by mouth daily. Swallow whole. Start taking on: December 14, 2021   atorvastatin 40 MG tablet Commonly known as: LIPITOR Take 1 tablet (40 mg total) by mouth daily. Start taking on: December 14, 2021   Butalbital-Acetaminophen 50-300 MG Caps Take 1 capsule by mouth as directed.   metoprolol succinate 50 MG 24 hr tablet Commonly known as: TOPROL-XL 100 mg daily.   multivitamin with minerals Tabs tablet Take 1 tablet by mouth daily.         DISCHARGE INSTRUCTIONS:   Follow-up PMD 5 days Dr. Donivan Scull office nurse will call you to set up echocardiogram appointment Follow-up with your neurologist  If you experience worsening of your admission symptoms, develop shortness of breath, life threatening emergency, suicidal or homicidal thoughts you must seek medical attention immediately by calling 911 or calling your MD immediately  if symptoms less severe.  You Must read complete instructions/literature along with all the possible adverse reactions/side effects for all the Medicines you take and that have been prescribed to you. Take any new Medicines after you have completely understood and accept all the possible adverse reactions/side effects.   Please note  You were cared  for by a hospitalist during your hospital stay. If you have any questions about your discharge medications or the care you received while you were in the hospital after you are discharged, you can call the unit and asked to speak with the hospitalist on call if the hospitalist that took care of you is not available. Once you are discharged, your primary care physician will handle any further  medical issues. Please note that NO REFILLS for any discharge medications will be authorized once you are discharged, as it is imperative that you return to your primary care physician (or establish a relationship with a primary care physician if you do not have one) for your aftercare needs so that they can reassess your need for medications and monitor your lab values.    Today   CHIEF COMPLAINT:   Chief Complaint  Patient presents with   Code Stroke    HISTORY OF PRESENT ILLNESS:  Richard West  is a 51 y.o. male coming in with numbness left side of his face lower part.   VITAL SIGNS:  Blood pressure 140/84, pulse 66, temperature 98.2 F (36.8 C), temperature source Oral, resp. rate 16, height 5\' 11"  (1.803 m), weight 115.7 kg, SpO2 96 %.  I/O:  No intake or output data in the 24 hours ending 12/13/21 1616  PHYSICAL EXAMINATION:  GENERAL:  51 y.o.-year-old patient lying in the bed with no acute distress.  EYES: Pupils equal, round, reactive to light and accommodation. No scleral icterus. Extraocular muscles intact.  HEENT: Head atraumatic, normocephalic. Oropharynx and nasopharynx clear.  LUNGS: Normal breath sounds bilaterally, no wheezing, rales,rhonchi or crepitation. No use of accessory muscles of respiration.  CARDIOVASCULAR: S1, S2 normal. No murmurs, rubs, or gallops.  ABDOMEN: Soft, non-tender, non-distended. Bowel sounds present. No organomegaly or mass.  EXTREMITIES: No pedal edema.  NEUROLOGIC: Cranial nerves II through XII are intact. Muscle strength 5/5 in all extremities. Sensation intact. Gait not checked.  PSYCHIATRIC: The patient is alert and oriented x 3.  SKIN: No obvious rash, lesion, or ulcer.   DATA REVIEW:   CBC Recent Labs  Lab 12/12/21 1945  WBC 13.3*  HGB 16.7  HCT 48.3  PLT 290    Chemistries  Recent Labs  Lab 12/12/21 1945  NA 139  K 3.7  CL 101  CO2 27  GLUCOSE 147*  BUN 19  CREATININE 1.01  CALCIUM 9.1  AST 30  ALT 34   ALKPHOS 55  BILITOT 0.7     Microbiology Results  Results for orders placed or performed during the hospital encounter of 12/12/21  Resp Panel by RT-PCR (Flu A&B, Covid) Nasopharyngeal Swab     Status: None   Collection Time: 12/12/21  9:21 PM   Specimen: Nasopharyngeal Swab; Nasopharyngeal(NP) swabs in vial transport medium  Result Value Ref Range Status   SARS Coronavirus 2 by RT PCR NEGATIVE NEGATIVE Final    Comment: (NOTE) SARS-CoV-2 target nucleic acids are NOT DETECTED.  The SARS-CoV-2 RNA is generally detectable in upper respiratory specimens during the acute phase of infection. The lowest concentration of SARS-CoV-2 viral copies this assay can detect is 138 copies/mL. A negative result does not preclude SARS-Cov-2 infection and should not be used as the sole basis for treatment or other patient management decisions. A negative result may occur with  improper specimen collection/handling, submission of specimen other than nasopharyngeal swab, presence of viral mutation(s) within the areas targeted by this assay, and inadequate number of viral copies(<138 copies/mL). A negative  result must be combined with clinical observations, patient history, and epidemiological information. The expected result is Negative.  Fact Sheet for Patients:  EntrepreneurPulse.com.au  Fact Sheet for Healthcare Providers:  IncredibleEmployment.be  This test is no t yet approved or cleared by the [place] FDA and  has been authorized for detection and/or diagnosis of SARS-CoV-2 by FDA under an Emergency Use Authorization (EUA). This EUA will remain  in effect (meaning this test can be used) for the duration of the COVID-19 declaration under Section 564(b)(1) of the Act, 21 U.S.C.section 360bbb-3(b)(1), unless the authorization is terminated  or revoked sooner.       Influenza A by PCR NEGATIVE NEGATIVE Final   Influenza B by PCR NEGATIVE NEGATIVE  Final    Comment: (NOTE) The Xpert Xpress SARS-CoV-2/FLU/RSV plus assay is intended as an aid in the diagnosis of influenza from Nasopharyngeal swab specimens and should not be used as a sole basis for treatment. Nasal washings and aspirates are unacceptable for Xpert Xpress SARS-CoV-2/FLU/RSV testing.  Fact Sheet for Patients: EntrepreneurPulse.com.au  Fact Sheet for Healthcare Providers: IncredibleEmployment.be  This test is not yet approved or cleared by the [place] FDA and has been authorized for detection and/or diagnosis of SARS-CoV-2 by FDA under an Emergency Use Authorization (EUA). This EUA will remain in effect (meaning this test can be used) for the duration of the COVID-19 declaration under Section 564(b)(1) of the Act, 21 U.S.C. section 360bbb-3(b)(1), unless the authorization is terminated or revoked.  Performed at [facility], [address] [place], [place] [address]     RADIOLOGY:  MR BRAIN WO CONTRAST  Result Date: 12/13/2021 CLINICAL DATA:  Initial evaluation for neuro deficit, stroke suspected. EXAM: MRI HEAD WITHOUT CONTRAST TECHNIQUE: Multiplanar, multiecho pulse sequences of the brain and surrounding structures were obtained without intravenous contrast. COMPARISON:  CT from 12/12/2021. FINDINGS: Brain: Cerebral volume within normal limits for patient age. No focal parenchymal signal abnormality identified. No abnormal foci of restricted diffusion to suggest acute or subacute ischemia. Gray-white matter differentiation well maintained. No encephalomalacia to suggest chronic infarction. No foci of susceptibility artifact to suggest acute or chronic intracranial hemorrhage. No mass lesion, midline shift or mass effect. No hydrocephalus. No extra-axial fluid collection. Pituitary gland and suprasellar region are normal. Midline structures intact and normal. Vascular: Major intracranial vascular flow voids well  maintained and normal in appearance. Skull and upper cervical spine: Craniocervical junction normal. Visualized upper cervical spine within normal limits. Bone marrow signal intensity normal. No scalp soft tissue abnormality. Sinuses/Orbits: Globes and orbital soft tissues within normal limits. Paranasal sinuses are largely clear. No significant mastoid effusion. Inner ear structures grossly normal. Other: None. IMPRESSION: Normal brain MRI. No acute intracranial abnormality identified. Electronically Signed   By: Jeannine Boga M.D.   On: 12/13/2021 00:53   US Carotid Bilateral (at [facility] and [facility] only)  Result Date: 12/13/2021 CLINICAL DATA:  Initial evaluation for acute stroke. EXAM: BILATERAL CAROTID DUPLEX ULTRASOUND TECHNIQUE: Pearline Cables scale imaging, color Doppler and duplex ultrasound were performed of bilateral carotid and vertebral arteries in the neck. COMPARISON:  MRI from earlier the same day. FINDINGS: Criteria: Quantification of carotid stenosis is based on velocity parameters that correlate the residual internal carotid diameter with NASCET-based stenosis levels, using the diameter of the distal internal carotid lumen as the denominator for stenosis measurement. The following velocity measurements were obtained: RIGHT ICA: 72/26 cm/sec CCA: 123456 cm/sec SYSTOLIC ICA/CCA RATIO:  1.3 ECA: 77 cm/sec LEFT ICA: 91/35 cm/sec CCA: 99/22  cm/sec SYSTOLIC ICA/CCA RATIO:  1.1 ECA: 98 cm/sec RIGHT CAROTID ARTERY: Right common carotid artery widely patent without stenosis. Minimal intimal irregularity and thickening about the right carotid bulb without significant stenosis. Visualized right ICA widely patent distally. RIGHT VERTEBRAL ARTERY:  Patent with antegrade flow. LEFT CAROTID ARTERY: Visualized left CCA widely patent without stenosis. Minimal intimal thickening about the left carotid bulb without stenosis. Visualized left ICA widely patent distally. LEFT VERTEBRAL ARTERY:  Patent with antegrade flow.  IMPRESSION: 1. Negative carotid Doppler ultrasound. Minimal intimal thickening about the carotid bulbs without hemodynamically significant stenosis. 2. Patent vertebral arteries with antegrade flow. Electronically Signed   By: Jeannine Boga M.D.   On: 12/13/2021 04:50   DG Chest Port 1 View  Result Date: 12/12/2021 CLINICAL DATA:  Jaw pain EXAM: PORTABLE CHEST 1 VIEW COMPARISON:  None. FINDINGS: The heart size and mediastinal contours are within normal limits. Both lungs are clear. The visualized skeletal structures are unremarkable. IMPRESSION: No active disease. Electronically Signed   By: Fidela Salisbury M.D.   On: 12/12/2021 22:26   CT HEAD CODE STROKE WO CONTRAST  Result Date: 12/12/2021 CLINICAL DATA:  Code stroke.  No deficit, stroke suspected. EXAM: CT HEAD WITHOUT CONTRAST TECHNIQUE: Contiguous axial images were obtained from the base of the skull through the vertex without intravenous contrast. COMPARISON:  None. FINDINGS: Brain: Cerebral volume within normal limits for patient age. No evidence for acute intracranial hemorrhage. No findings to suggest acute large vessel territory infarct. No mass lesion, midline shift, or mass effect. Ventricles are normal in size without evidence for hydrocephalus. No extra-axial fluid collection identified. Probable mild Chiari malformation with the cerebellar tonsils extending up to 9 mm below the foramen magnum on the right. Vascular: No hyperdense vessel identified. Skull: Scalp soft tissues demonstrate no acute abnormality. Calvarium intact. Sinuses/Orbits: Globes and orbital soft tissues within normal limits. Visualized paranasal sinuses are clear. No mastoid effusion. ASPECTS [facility] Stroke Program Early CT Score) - Ganglionic level infarction (caudate, lentiform nuclei, internal capsule, insula, M1-M3 cortex): 7 - Supraganglionic infarction (M4-M6 cortex): 3 Total score (0-10 with 10 being normal): 10 IMPRESSION: 1. Negative head CT.  No acute  intracranial abnormality. 2. ASPECTS is 10. 3. Mild Chiari 1 malformation. Critical Value/emergent results were called by telephone at the time of interpretation on 12/12/2021 at 8:07 pm to provider [facility] , who verbally acknowledged these results. Electronically Signed   By: Jeannine Boga M.D.   On: 12/12/2021 20:10      Management plans discussed with the patient, family and they are in agreement.  CODE STATUS:     Code Status Orders  (From admission, onward)           Start     Ordered   12/12/21 2135  Full code  Continuous        12/12/21 2137           Code Status History     This patient has a current code status but no historical code status.       TOTAL TIME TAKING CARE OF THIS PATIENT: 35 minutes.    Loletha Grayer M.D on 12/13/2021 at 4:16 PM   [facility]  CC: Primary care physician; Sofie Hartigan, MD

## 2021-12-13 NOTE — Evaluation (Signed)
Occupational Therapy Evaluation Patient Details Name: Richard West MRN: [id number] DOB: 1971/06/01 Today's Date: 12/13/2021   History of Present Illness Pt is a 51 year old male presenting to the ED within 1 hour of the onset of L sided facial numbness ad weakness, dysarthria, trouble swallowing and slurred speech following a 2 week history of flulike symptoms with low grade fever, sore throat, runny nose and mild cough. PMH sig for chronic migraines. Head CT code stroke negative   Clinical Impression   Chart reviewed, RN cleared pt for participation in OT eval. Pt is greeted in hallway bed in ED, alert and oriented x4, agreeable to OT eval. Pt reports he feels he is back to baseline functioning. PTA pt is Indep in all ADL/IADL and works in Engineer, technical sales. On eval pt FMC, dexterity, strength, vision, perception all appear WFL and at baseline. Pt performs ADL with MOD I-I. Pt provided education on role of OT, role of rehab, home safety; all questions answered appropriately. Pt appears to be performing at baseline functioning. He has no OT needs at this time. If there is a change in functional status please re-consult. Pt is left in bed, NAD all needs met.      Recommendations for follow up therapy are one component of a multi-disciplinary discharge planning process, led by the attending physician.  Recommendations may be updated based on patient status, additional functional criteria and insurance authorization.   Follow Up Recommendations  No OT follow up    Assistance Recommended at Discharge PRN  Patient can return home with the following      Functional Status Assessment  Patient has not had a recent decline in their functional status  Equipment Recommendations  None recommended by OT    Recommendations for Other Services       Precautions / Restrictions Precautions Precautions: Fall      Mobility Bed Mobility Overal bed mobility: Independent                  Transfers Overall  transfer level: Independent                 General transfer comment: no AD needed      Balance                                           ADL either performed or assessed with clinical judgement   ADL Overall ADL's : Independent;Modified independent                                       General ADL Comments: MOD I-I in all ADL on assessment- functional ambuation, grooming tasks at sink level, simulated toilet transfer; pt reports he feels he is back to baseline     Vision Baseline Vision/History: 1 Wears glasses Patient Visual Report: No change from baseline       Perception     Praxis      Pertinent Vitals/Pain Pain Assessment: Faces Faces Pain Scale: Hurts a little bit Pain Location: chronic headache per pt report Pain Descriptors / Indicators: Aching Pain Intervention(s): Limited activity within patient's tolerance;Monitored during session     Hand Dominance Right   Extremity/Trunk Assessment Upper Extremity Assessment Upper Extremity Assessment: Overall WFL for tasks assessed   Lower Extremity Assessment Lower Extremity  Assessment: Overall WFL for tasks assessed   Cervical / Trunk Assessment Cervical / Trunk Assessment: Normal   Communication Communication Communication: No difficulties   Cognition Arousal/Alertness: Awake/alert Behavior During Therapy: WFL for tasks assessed/performed Overall Cognitive Status: Within Functional Limits for tasks assessed                                 General Comments: alert and oriented x4, good historian, safety awareness     General Comments  SPO2 96%, HR 75 BP 153/80 following eval    Exercises Other Exercises Other Exercises: edu re: role of OT, role of rehab, home safety   Shoulder Instructions      Home Living Family/patient expects to be discharged to:: Private residence Living Arrangements: Spouse/significant other;Children (33 year old  daugther) Available Help at Discharge: Family Type of Home: House Home Access: Level entry     Home Layout: One level         Bathroom Toilet: Bryce: None          Prior Functioning/Environment Prior Level of Function : Independent/Modified Independent             Mobility Comments: Indep with no AD ADLs Comments: Indep with all ADL/IADL; works in Engineer, technical sales, cooks, Microbiologist, drives, does Personnel officer goals can be found in the care plan section) Acute Rehab OT Goals Patient Stated Goal: to go home OT Goal Formulation: With patient Time For Goal Achievement: 12/27/21 Potential to Achieve Goals: Good      AM-PAC OT "6 Clicks" Daily Activity     Outcome Measure Help from another person eating meals?: None Help from another person taking care of personal grooming?: None Help from another person toileting, which includes using toliet, bedpan, or urinal?: None Help from another person bathing (including washing, rinsing, drying)?: None Help from another person to put on and taking off regular upper body clothing?: None Help from another person to put on and taking off regular lower body clothing?: None 6 Click Score: 24   End of Session Nurse Communication: Mobility status  Activity Tolerance: Patient tolerated treatment well Patient left: in bed;with call bell/phone within reach  OT Visit Diagnosis: Other symptoms and signs involving the nervous system (R29.898)                Time: 3300-7622 OT Time Calculation (min): 12 min Charges:  OT General Charges $OT Visit: 1 Visit OT Evaluation $OT Eval Low Complexity: 1 Low  Shanon Payor, OTD OTR/L  12/13/21, 9:59 AM

## 2021-12-19 ENCOUNTER — Ambulatory Visit
Admission: RE | Admit: 2021-12-19 | Discharge: 2021-12-19 | Disposition: A | Discharge status: Home / Self Care | Payer: BC Managed Care – PPO | Source: Ambulatory Visit | Attending: Family Medicine | Admitting: Family Medicine

## 2021-12-19 ENCOUNTER — Other Ambulatory Visit (HOSPITAL_COMMUNITY): Payer: Self-pay | Admitting: Family Medicine

## 2021-12-19 ENCOUNTER — Other Ambulatory Visit: Payer: Self-pay | Admitting: Family Medicine

## 2021-12-19 ENCOUNTER — Other Ambulatory Visit: Payer: Self-pay

## 2021-12-19 DIAGNOSIS — R600 Localized edema: Secondary | ICD-10-CM | POA: Insufficient documentation

## 2021-12-19 DIAGNOSIS — R509 Fever, unspecified: Secondary | ICD-10-CM | POA: Diagnosis present

## 2021-12-25 ENCOUNTER — Other Ambulatory Visit: Payer: Self-pay | Admitting: Family Medicine

## 2021-12-25 DIAGNOSIS — R2242 Localized swelling, mass and lump, left lower limb: Secondary | ICD-10-CM

## 2021-12-27 ENCOUNTER — Ambulatory Visit
Admission: RE | Admit: 2021-12-27 | Discharge: 2021-12-27 | Disposition: A | Discharge status: Home / Self Care | Payer: BC Managed Care – PPO | Source: Ambulatory Visit | Attending: Family Medicine | Admitting: Family Medicine

## 2021-12-27 ENCOUNTER — Other Ambulatory Visit: Payer: Self-pay

## 2021-12-27 DIAGNOSIS — R2242 Localized swelling, mass and lump, left lower limb: Secondary | ICD-10-CM | POA: Insufficient documentation

## 2022-01-03 ENCOUNTER — Telehealth: Payer: Self-pay | Admitting: *Deleted

## 2022-01-03 NOTE — Telephone Encounter (Signed)
Pt. Richard West wants an appt. for restless leg soon referral sent by [facility]. States he may need to go to UC if cannot be seen soon.

## 2022-01-09 ENCOUNTER — Ambulatory Visit (INDEPENDENT_AMBULATORY_CARE_PROVIDER_SITE_OTHER): Payer: BC Managed Care – PPO

## 2022-01-09 ENCOUNTER — Other Ambulatory Visit: Payer: Self-pay

## 2022-01-09 DIAGNOSIS — G459 Transient cerebral ischemic attack, unspecified: Secondary | ICD-10-CM | POA: Diagnosis not present

## 2022-01-09 LAB — ECHOCARDIOGRAM COMPLETE
AR max vel: 2.87 cm2
AV Area VTI: 2.97 cm2
AV Area mean vel: 2.71 cm2
AV Mean grad: 6 mmHg
AV Peak grad: 10.8 mmHg
Ao pk vel: 1.64 m/s
Area-P 1/2: 3.03 cm2
Calc EF: 56.5 %
S' Lateral: 3.3 cm
Single Plane A2C EF: 53.7 %
Single Plane A4C EF: 58.9 %

## 2022-01-10 ENCOUNTER — Encounter: Payer: Self-pay | Admitting: Infectious Diseases

## 2022-01-10 ENCOUNTER — Other Ambulatory Visit
Admission: RE | Admit: 2022-01-10 | Discharge: 2022-01-10 | Disposition: A | Discharge status: Home / Self Care | Payer: BC Managed Care – PPO | Attending: Infectious Diseases | Admitting: Infectious Diseases

## 2022-01-10 ENCOUNTER — Ambulatory Visit: Payer: BC Managed Care – PPO | Attending: Infectious Diseases | Admitting: Infectious Diseases

## 2022-01-10 VITALS — BP 151/87 | HR 64 | Temp 98.1°F | Wt 249.0 lb

## 2022-01-10 DIAGNOSIS — Z7901 Long term (current) use of anticoagulants: Secondary | ICD-10-CM | POA: Insufficient documentation

## 2022-01-10 DIAGNOSIS — M791 Myalgia, unspecified site: Secondary | ICD-10-CM | POA: Diagnosis not present

## 2022-01-10 DIAGNOSIS — G43909 Migraine, unspecified, not intractable, without status migrainosus: Secondary | ICD-10-CM | POA: Diagnosis not present

## 2022-01-10 DIAGNOSIS — R509 Fever, unspecified: Secondary | ICD-10-CM | POA: Insufficient documentation

## 2022-01-10 DIAGNOSIS — M7989 Other specified soft tissue disorders: Secondary | ICD-10-CM | POA: Insufficient documentation

## 2022-01-10 DIAGNOSIS — Z8673 Personal history of transient ischemic attack (TIA), and cerebral infarction without residual deficits: Secondary | ICD-10-CM | POA: Insufficient documentation

## 2022-01-10 LAB — CBC WITH DIFFERENTIAL/PLATELET
Abs Immature Granulocytes: 0.01 10*3/uL (ref 0.00–0.07)
Basophils Absolute: 0.1 10*3/uL (ref 0.0–0.1)
Basophils Relative: 1 %
Eosinophils Absolute: 0.1 10*3/uL (ref 0.0–0.5)
Eosinophils Relative: 2 %
HCT: 46.8 % (ref 39.0–52.0)
Hemoglobin: 16.1 g/dL (ref 13.0–17.0)
Immature Granulocytes: 0 %
Lymphocytes Relative: 30 %
Lymphs Abs: 2 10*3/uL (ref 0.7–4.0)
MCH: 33.1 pg (ref 26.0–34.0)
MCHC: 34.4 g/dL (ref 30.0–36.0)
MCV: 96.1 fL (ref 80.0–100.0)
Monocytes Absolute: 0.6 10*3/uL (ref 0.1–1.0)
Monocytes Relative: 9 %
Neutro Abs: 3.7 10*3/uL (ref 1.7–7.7)
Neutrophils Relative %: 58 %
Platelets: 290 10*3/uL (ref 150–400)
RBC: 4.87 MIL/uL (ref 4.22–5.81)
RDW: 11.6 % (ref 11.5–15.5)
WBC: 6.4 10*3/uL (ref 4.0–10.5)
nRBC: 0 % (ref 0.0–0.2)

## 2022-01-10 LAB — CK: Total CK: 129 U/L (ref 49–397)

## 2022-01-10 NOTE — Patient Instructions (Signed)
Today you are ehre for pain legs ( migratory) subjective fever Please stop tylenol, record your temperature three times a day, Monitor for any new swelling and take picture or video Today will do many labs for parasites Will follow in 2 weeks

## 2022-01-10 NOTE — Progress Notes (Signed)
NAME: Richard West  DOB: 1971/02/27  MRN: [id number]  Date/Time: 01/10/2022 12:22 PM   Subjective:   ? Richard West is a 51 y.o. 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 He was fine when he returned home on 11/25/21. His daughter was sick with resp symptoms. On 11/29/21 he had subjective fever and chills. Followed by 2 days of heartburn and then 2 days of sore throat which resolved Thought it was the flu- He went to beach for 4 days during christmas to [place]. He then presented to the ED on 12/11/21 and was diagnosed with viral illness. He presented the next day with left sided jaw numbness and was hospitalized for a day and MRI of brain was normal. It was thought to be transient neurologic deficit/. He has a loop recorder now. Pt has been having b/l leg pain and calf pain for the past 3 weeks- it is migratory and he also had a swelling on the left thigh above the knee . He had US of the left leg on 12/27/21 and it showed In the area of interest of the distal left medial thigh there is question of edema with mildly decreased definition of the muscle fibers. Possible central linear tear in this region. He also had rt thigh transient swelling Today he says he is fine Past Medical History:  Diagnosis Date   Headache     No past surgical history on file.  Social History   Socioeconomic History   Marital status: Unknown    Spouse name: Not on file   Number of children: Not on file   Years of education: Not on file   Highest education level: Not on file  Occupational History   Not on file  Tobacco Use   Smoking status: Never   Smokeless tobacco: Never  Vaping Use    Vaping Use: Never used  Substance and Sexual Activity   Alcohol use: Yes    Comment: social   Drug use: Not on file   Sexual activity: Not on file  Other Topics Concern   Not on file  Social History Narrative   Not on file   Social Determinants of Health   Financial Resource Strain: Not on file  Food Insecurity: Not on file  Transportation Needs: Not on file  Physical Activity: Not on file  Stress: Not on file  Social Connections: Not on file  Intimate Partner Violence: Not on file    No family history on file. No Known Allergies I? Current Outpatient Medications  Medication Sig Dispense Refill   aspirin EC 81 MG EC tablet Take 1 tablet (81 mg total) by mouth daily. Swallow whole. 30 tablet 0   atorvastatin (LIPITOR) 40 MG tablet Take 1 tablet (40 mg total) by mouth daily. 30 tablet 0   botulinum toxin Type A (BOTOX) 100 units SOLR injection Botox 100 unit injection  Take 200 units by injection route.     Butalbital-Acetaminophen 50-300 MG CAPS Take 1 capsule by mouth as directed.     metoprolol succinate (TOPROL-XL) 50 MG 24 hr tablet 100 mg daily.  Multiple Vitamin (MULTIVITAMIN WITH MINERALS) TABS tablet Take 1 tablet by mouth daily.     No current facility-administered medications for this visit.     Abtx:  Anti-infectives (From admission, onward)    None       REVIEW OF SYSTEMS:  Const: subjective fever, negative chills, negative weight loss Eyes: negative diplopia or visual changes, negative eye pain ENT: negative coryza, negative sore throat Resp: negative cough, hemoptysis, dyspnea Cards: negative for chest pain, palpitations, lower extremity edema GU: negative for frequency, dysuria and hematuria GI: Negative for abdominal pain, diarrhea, bleeding, constipation Skin: negative for rash and pruritus Heme: negative for easy bruising and gum/nose bleeding MS: myalgias, achilles tendon pain, no arthralgias, back pain and muscle weakness Neurolo:negative  for headaches, dizziness, vertigo, memory problems  Psych: negative for feelings of anxiety, depression  Endocrine: negative for thyroid, diabetes Allergy/Immunology- negative for any medication or food allergies ? Objective:  VITALS:  BP (!) 151/87    Pulse 64    Temp 98.1 F (36.7 C) (Oral)    Wt 249 lb (112.9 kg)    BMI 34.73 kg/m  PHYSICAL EXAM:  General: Alert, cooperative, no distress, appears stated age.  Head: Normocephalic, without obvious abnormality, atraumatic. Eyes: Conjunctivae clear, anicteric sclerae. Pupils are equal ENT Nares normal. No drainage or sinus tenderness. Lips, mucosa, and tongue normal. No Thrush Neck: Supple, symmetrical, no adenopathy, thyroid: non tender no carotid bruit and no JVD. Back: No CVA tenderness. Lungs: Clear to auscultation bilaterally. No Wheezing or Rhonchi. No rales. Heart: Regular rate and rhythm, no murmur, rub or gallop. Abdomen: Soft, non-tender,not distended. Bowel sounds normal. No masses Extremities: atraumatic, no cyanosis. No edema. No clubbing Skin: No rashes or lesions. Or bruising Lymph: Cervical, supraclavicular normal. Neurologic: Grossly non-focal Pertinent Labs Lab Results CBC    Component Value Date/Time   WBC 13.3 (H) 12/12/2021 1945   RBC 4.83 12/12/2021 1945   HGB 16.7 12/12/2021 1945   HCT 48.3 12/12/2021 1945   PLT 290 12/12/2021 1945   MCV 100.0 12/12/2021 1945   MCH 34.6 (H) 12/12/2021 1945   MCHC 34.6 12/12/2021 1945   RDW 12.0 12/12/2021 1945   LYMPHSABS 2.6 12/12/2021 1945   MONOABS 0.9 12/12/2021 1945   EOSABS 0.3 12/12/2021 1945   BASOSABS 0.1 12/12/2021 1945    CMP Latest Ref Rng & Units 12/12/2021  Glucose 70 - 99 mg/dL 147(H)  BUN 6 - 20 mg/dL 19  Creatinine 0.61 - 1.24 mg/dL 1.01  Sodium 135 - 145 mmol/L 139  Potassium 3.5 - 5.1 mmol/L 3.7  Chloride 98 - 111 mmol/L 101  CO2 22 - 32 mmol/L 27  Calcium 8.9 - 10.3 mg/dL 9.1  Total Protein 6.5 - 8.1 g/dL 8.0  Total Bilirubin 0.3 - 1.2  mg/dL 0.7  Alkaline Phos 38 - 126 U/L 55  AST 15 - 41 U/L 30  ALT 0 - 44 U/L 34      Microbiology: 12/19/21 B NG Ck N' Tsh N CRP 55 ANA ng  CBC with diff- no eosinophilia Stool O/P  IMAGING RESULTS: 12/12/21- CXR N I have personally reviewed the films ? Impression/Recommendation ?subjective fever, followed by myalgia legs, with transient/migratory lumps-  one seen today Concern would be for a parasitic infection ? [place] swelling but LOA-LOA not in [place] Strongyloides? Does not look like larva migrans Filariasis? ? Trichinella ( did not consume game meat/bear) Will check some labs- including parasite smear, filarial antibodies, Strongyloides, eosinophil count, IgE, CK  ? Migraine -  on metoprolol  Recent TIA- has a loop recorder ___________________________________________________ Discussed with patient, Follow up 2 weeks Note:  This document was prepared using Dragon voice recognition software and may include unintentional dictation errors.

## 2022-01-11 ENCOUNTER — Other Ambulatory Visit
Admission: RE | Admit: 2022-01-11 | Discharge: 2022-01-11 | Disposition: A | Discharge status: Home / Self Care | Payer: BC Managed Care – PPO | Source: Ambulatory Visit | Attending: Infectious Diseases | Admitting: Infectious Diseases

## 2022-01-11 DIAGNOSIS — M7989 Other specified soft tissue disorders: Secondary | ICD-10-CM | POA: Diagnosis present

## 2022-01-11 DIAGNOSIS — R509 Fever, unspecified: Secondary | ICD-10-CM | POA: Insufficient documentation

## 2022-01-11 LAB — STRONGYLOIDES, AB, IGG: Strongyloides, Ab, IgG: NEGATIVE

## 2022-01-13 ENCOUNTER — Other Ambulatory Visit
Admission: RE | Admit: 2022-01-13 | Discharge: 2022-01-13 | Disposition: A | Discharge status: Home / Self Care | Payer: BC Managed Care – PPO | Source: Ambulatory Visit | Attending: Infectious Diseases | Admitting: Infectious Diseases

## 2022-01-13 DIAGNOSIS — M7989 Other specified soft tissue disorders: Secondary | ICD-10-CM | POA: Insufficient documentation

## 2022-01-13 DIAGNOSIS — R509 Fever, unspecified: Secondary | ICD-10-CM | POA: Insufficient documentation

## 2022-01-14 ENCOUNTER — Telehealth: Payer: Self-pay

## 2022-01-14 ENCOUNTER — Other Ambulatory Visit
Admission: RE | Admit: 2022-01-14 | Discharge: 2022-01-14 | Disposition: A | Discharge status: Home / Self Care | Payer: BC Managed Care – PPO | Source: Ambulatory Visit | Attending: Infectious Diseases | Admitting: Infectious Diseases

## 2022-01-14 DIAGNOSIS — R509 Fever, unspecified: Secondary | ICD-10-CM | POA: Insufficient documentation

## 2022-01-14 DIAGNOSIS — M7989 Other specified soft tissue disorders: Secondary | ICD-10-CM | POA: Diagnosis not present

## 2022-01-14 NOTE — Telephone Encounter (Signed)
Able to reach pt regarding his recent ECHO Dr. Mariah Milling had a chance to review his results and advised   "Echocardiogram  Normal LV function, normal RV function, no significant valve disease, normal pressures "  Richard West very thankful for the phone call of his results, all questions and concerns were address with nothing further at this time. Will see at next schedule f/u appt.

## 2022-01-15 ENCOUNTER — Telehealth: Payer: Self-pay

## 2022-01-15 ENCOUNTER — Encounter (INDEPENDENT_AMBULATORY_CARE_PROVIDER_SITE_OTHER): Admitting: Internal Medicine

## 2022-01-15 LAB — OVA + PARASITE EXAM

## 2022-01-15 LAB — O&P RESULT

## 2022-01-15 LAB — IGE: IgE (Immunoglobulin E), Serum: 35 IU/mL (ref 6–495)

## 2022-01-15 NOTE — Telephone Encounter (Signed)
I spoke to the patient he states he has not had any fevers since prior to his visit with our office. He will also upload a picture of temperature log to his mychart. I also advised him we are waiting for the rest of his lab to result.Patient also stated he would be in [place] on the day of his follow up appointment on 01/24/22. Patient would like for this to be an phone appointment. Patient is also asking if he could be prescribed an antibiotics and reports having a lot of pain. Please advise. Sheyanne Munley T Pricilla Loveless

## 2022-01-16 ENCOUNTER — Encounter: Payer: Self-pay | Admitting: Infectious Diseases

## 2022-01-16 DIAGNOSIS — M7918 Myalgia, other site: Secondary | ICD-10-CM

## 2022-01-16 DIAGNOSIS — Z789 Other specified health status: Secondary | ICD-10-CM

## 2022-01-16 LAB — PARASITE EXAM, BLOOD

## 2022-01-16 NOTE — Telephone Encounter (Signed)
Attempted to contact patient regarding antibiotic request. Patient did not answer and no secured voicemail setup. Richard West

## 2022-01-17 ENCOUNTER — Other Ambulatory Visit
Admission: RE | Admit: 2022-01-17 | Discharge: 2022-01-17 | Disposition: A | Discharge status: Home / Self Care | Payer: BC Managed Care – PPO | Attending: Infectious Diseases | Admitting: Infectious Diseases

## 2022-01-17 DIAGNOSIS — Z789 Other specified health status: Secondary | ICD-10-CM | POA: Insufficient documentation

## 2022-01-17 DIAGNOSIS — M7918 Myalgia, other site: Secondary | ICD-10-CM | POA: Insufficient documentation

## 2022-01-17 LAB — CK: Total CK: 167 U/L (ref 49–397)

## 2022-01-17 LAB — C-REACTIVE PROTEIN: CRP: 1 mg/dL — ABNORMAL HIGH (ref <1.0)

## 2022-01-17 LAB — O&P RESULT

## 2022-01-17 LAB — OVA + PARASITE EXAM

## 2022-01-17 LAB — SEDIMENTATION RATE: Sed Rate: 18 mm/hr (ref 0–20)

## 2022-01-17 NOTE — Telephone Encounter (Signed)
Dr Rivka Safer has spoke to the patient.

## 2022-01-18 LAB — ALDOLASE: Aldolase: 5 U/L (ref 3.3–10.3)

## 2022-01-24 ENCOUNTER — Ambulatory Visit: Payer: BC Managed Care – PPO | Attending: Infectious Diseases | Admitting: Infectious Diseases

## 2022-01-24 ENCOUNTER — Other Ambulatory Visit: Payer: Self-pay

## 2022-01-24 DIAGNOSIS — R509 Fever, unspecified: Secondary | ICD-10-CM | POA: Diagnosis not present

## 2022-01-24 LAB — MISC LABCORP TEST (SEND OUT): Labcorp test code: 9985

## 2022-01-24 NOTE — Progress Notes (Signed)
The purpose of this virtual visit is to provide medical care while limiting exposure to the novel coronavirus (COVID19) for both patient and office staff.   Consent was obtained for phone visit:  Yes.   Answered questions that patient had about telehealth interaction:  Yes.   I discussed the limitations, risks, security and privacy concerns of performing an evaluation and management service by telephone. I also discussed with the patient that there may be a patient responsible charge related to this service. The patient expressed understanding and agreed to proceed.   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[employer], structural: office  This was a call to discuss lab results and also to follow up on his symptoms I had seen him on 01/10/22 for subjective fever and migrating lumps in his legs after a visit to [place] in Dec He was asked to maintain a temperature log an that was normal He does not have anymore subjective fever He does have swelling of his legs below the calf muscles b/l intermittently- He has pain in the calf 'He has not noted any lumps  Work up included Strongyloides ab-neg Filarial antibody pending Stool o/p X 3 neg Peripheral smear for parasite negative ESR from 01/17/22 was 18( was 71 in Jan 2023 done at [facility] clinic) CRP 1 ( < 1 range ) Shared with patient all labs and will let him know of the filaria antibody once available Follow up with PCP for calf pain and edema Total time 7 min on the phone call

## 2022-02-11 ENCOUNTER — Encounter (INDEPENDENT_AMBULATORY_CARE_PROVIDER_SITE_OTHER): Admitting: Internal Medicine

## 2022-02-26 ENCOUNTER — Telehealth (INDEPENDENT_AMBULATORY_CARE_PROVIDER_SITE_OTHER): Admitting: Internal Medicine

## 2022-02-26 NOTE — Telephone Encounter (Signed)
Colon recall letter "return to sender, not deliverable as addressed, unable to forward".

## 2022-12-16 IMAGING — CT CT HEAD CODE STROKE
4 series · 16 of 47 positions shown, 18 images · non-contrast
Comparison: None.

CLINICAL DATA: Code stroke.  No deficit, stroke suspected.

EXAM:
CT HEAD WITHOUT CONTRAST
TECHNIQUE: Contiguous axial images were obtained from the base of the skull
through the vertex without intravenous contrast.

[Series 3: head bone · axial · 0.45mm/px · z∈[-102,-70]mm · 3 of 82 slices shown]
[im 9/82  bone]
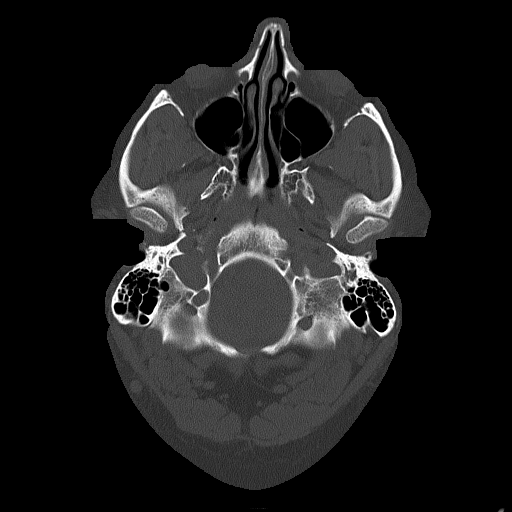
[im 17/82  bone]
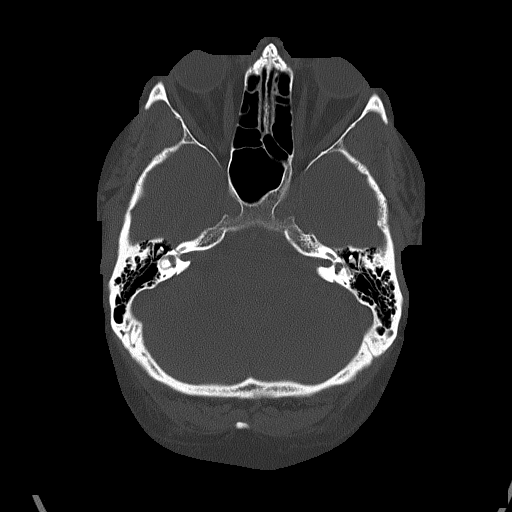
[im 25/82  bone]
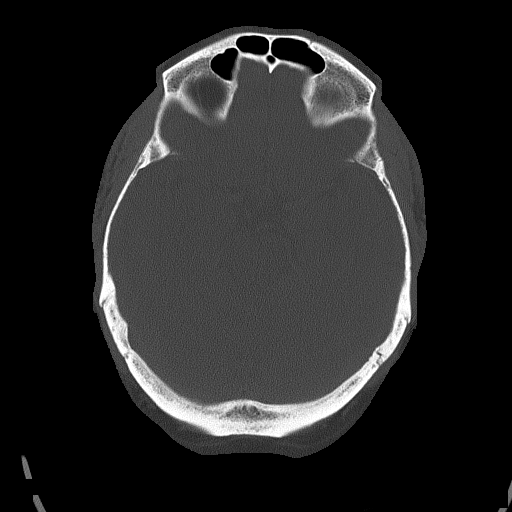

[Series 4: head wo · axial · 0.45mm/px · z∈[-98,+22]mm · 7 of 33 slices shown, 9 images]
[im 5/33  brain]
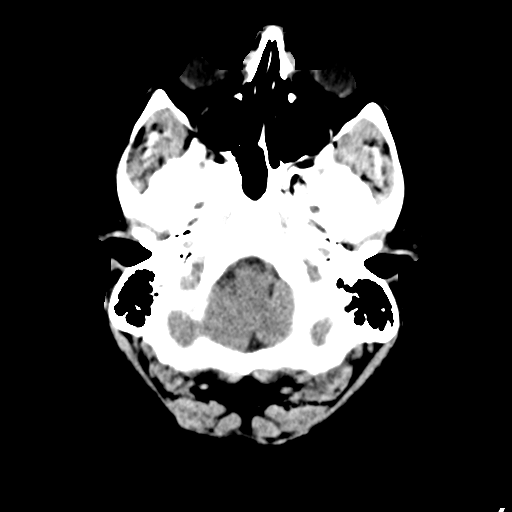
[im 5/33  bone]
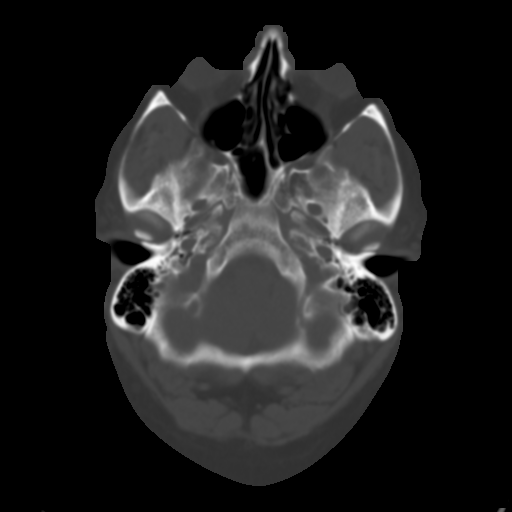
[im 9/33  brain]
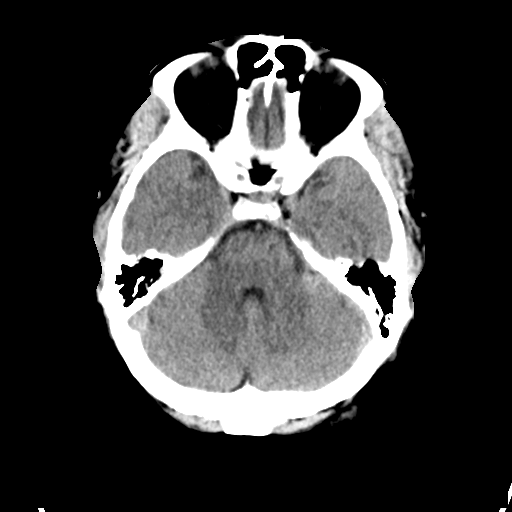
[im 13/33  brain]
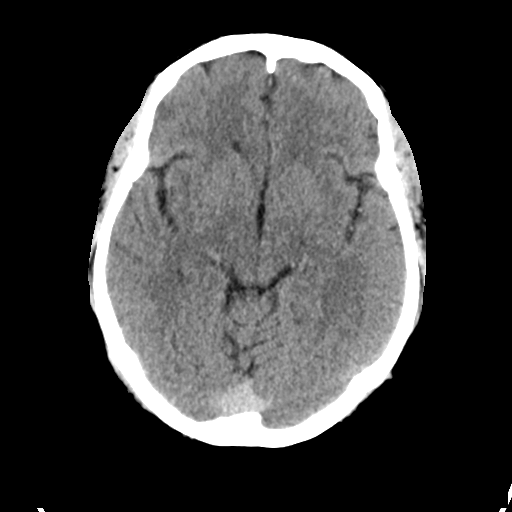
[im 17/33  brain]
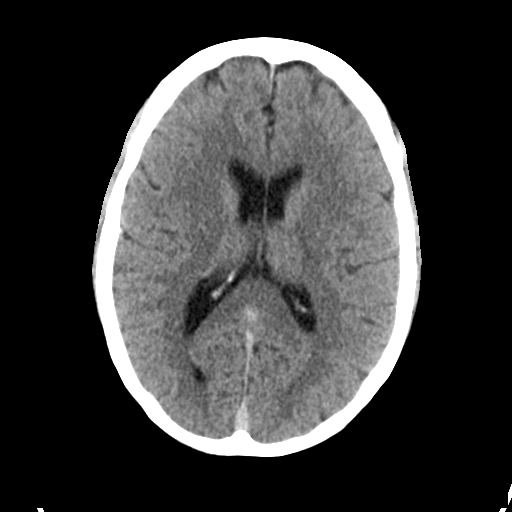
[im 21/33  brain]
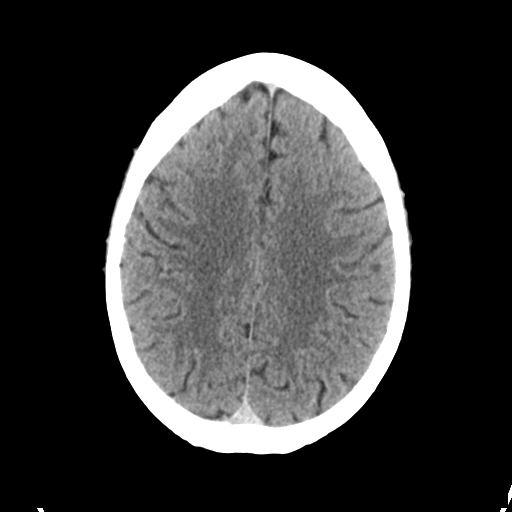
[im 21/33  bone]
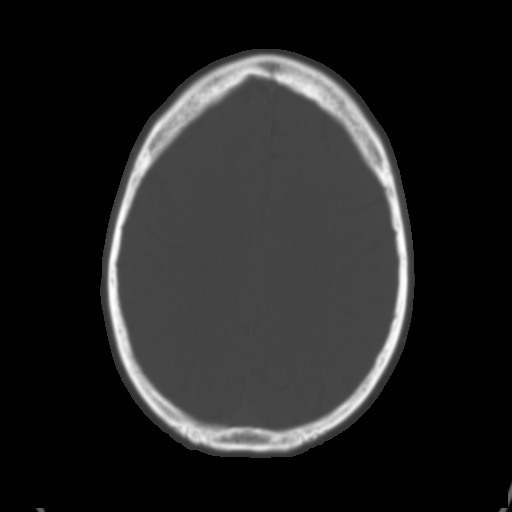
[im 25/33  brain]
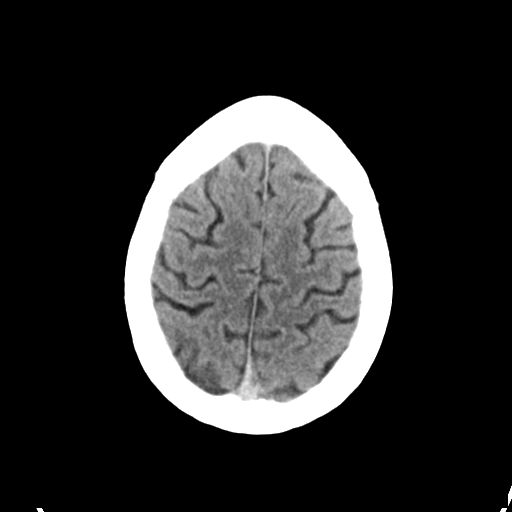
[im 29/33  brain]
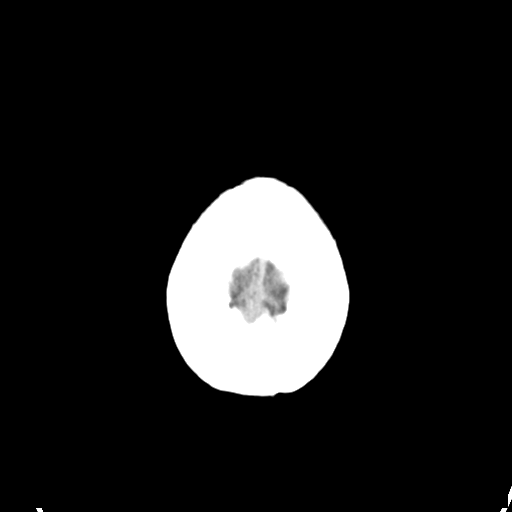

[Series 5: coronal soft tissue · coronal · 0.33mm/px · 3 of 72 slices shown]
[im 24/72  brain]
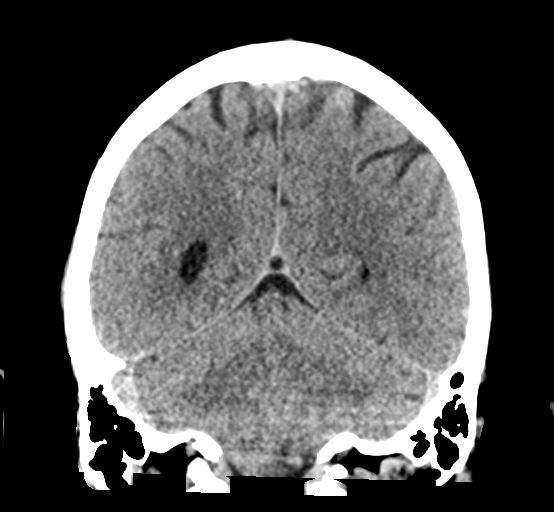
[im 32/72  brain]
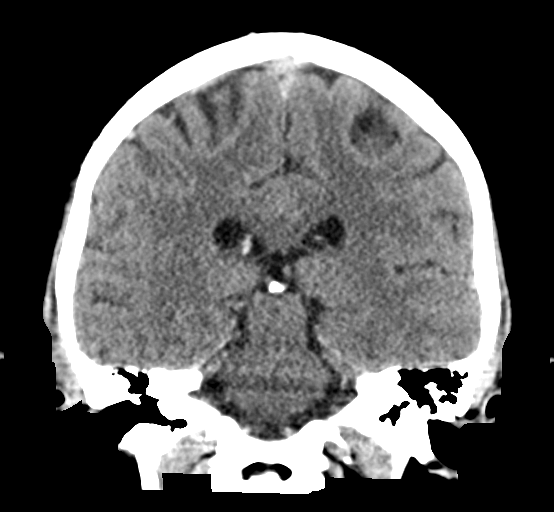
[im 40/72  brain]
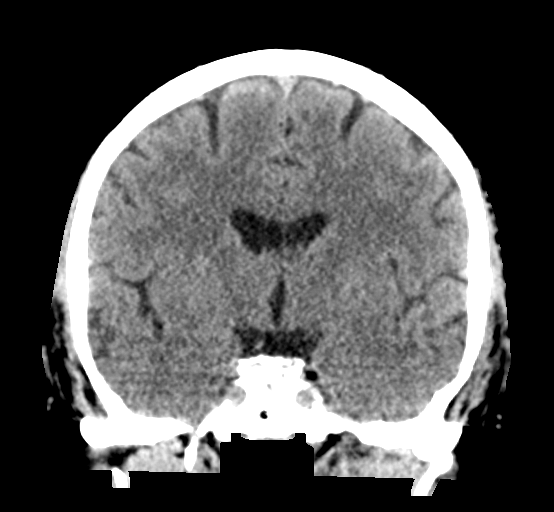

[Series 6: sagittal soft tissue · sagittal · 0.33mm/px · 3 of 62 slices shown]
[im 21/62  brain]
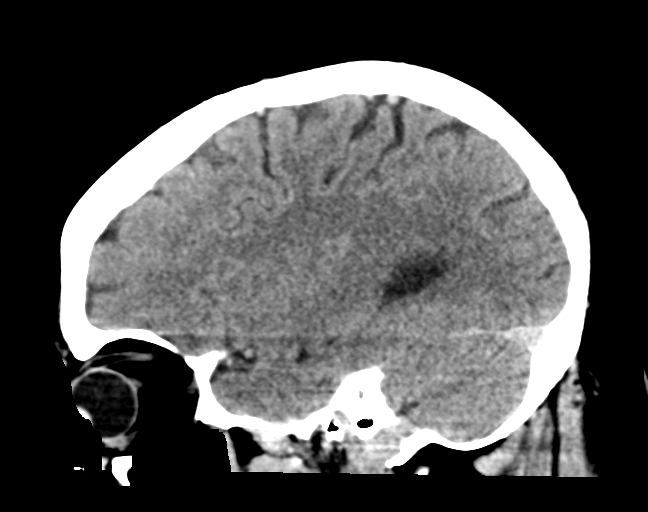
[im 31/62  brain]
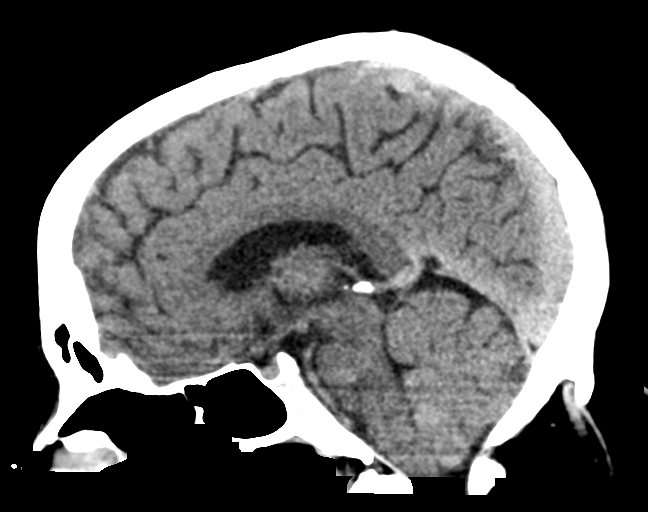
[im 41/62  brain]
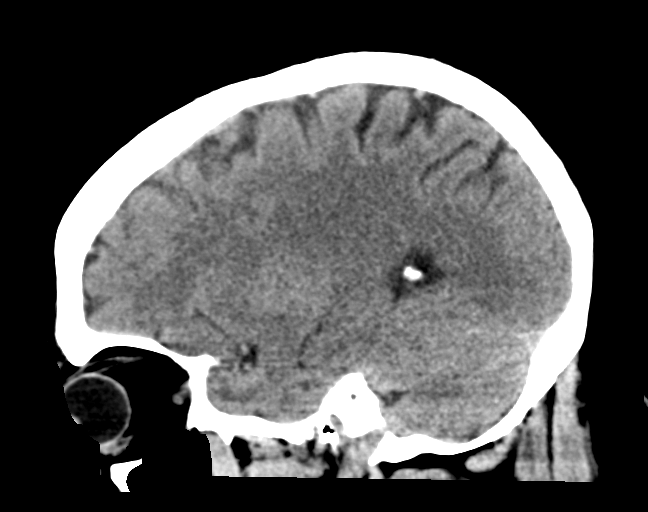

[16 of 47 positions shown; findings below may reference images not displayed]

FINDINGS: Brain: Cerebral volume within normal limits for patient age.

No evidence for acute intracranial hemorrhage. No findings to
suggest acute large vessel territory infarct. No mass lesion,
midline shift, or mass effect. Ventricles are normal in size without
evidence for hydrocephalus. No extra-axial fluid collection
identified. Probable mild Chiari malformation with the cerebellar
tonsils extending up to 9 mm below the foramen magnum on the right.

Vascular: No hyperdense vessel identified.

Skull: Scalp soft tissues demonstrate no acute abnormality.
Calvarium intact.

Sinuses/Orbits: Globes and orbital soft tissues within normal
limits.

Visualized paranasal sinuses are clear. No mastoid effusion.

ASPECTS (Alberta Stroke Program Early CT Score)

- Ganglionic level infarction (caudate, lentiform nuclei, internal
capsule, insula, M1-M3 cortex): 7

- Supraganglionic infarction (M4-M6 cortex): 3

Total score (0-10 with 10 being normal): 10
IMPRESSION: 1. Negative head CT.  No acute intracranial abnormality.
2. ASPECTS is 10.
3. Mild Chiari 1 malformation.

Critical Value/emergent results were called by telephone at the time
of interpretation on 12/12/2021 at [DATE] to provider NAHUM MICHELLE
, who verbally acknowledged these results.

## 2022-12-17 IMAGING — US US CAROTID DUPLEX BILAT
1 series · 13 of 24 positions shown · non-contrast
Comparison: MRI from earlier the same day.

CLINICAL DATA: Initial evaluation for acute stroke.

EXAM:
BILATERAL CAROTID DUPLEX ULTRASOUND
TECHNIQUE: Gray scale imaging, color Doppler and duplex ultrasound were
performed of bilateral carotid and vertebral arteries in the neck.

[Series 1: us carotid bilateral · 13 of 62 slices shown]
[im 1/62]
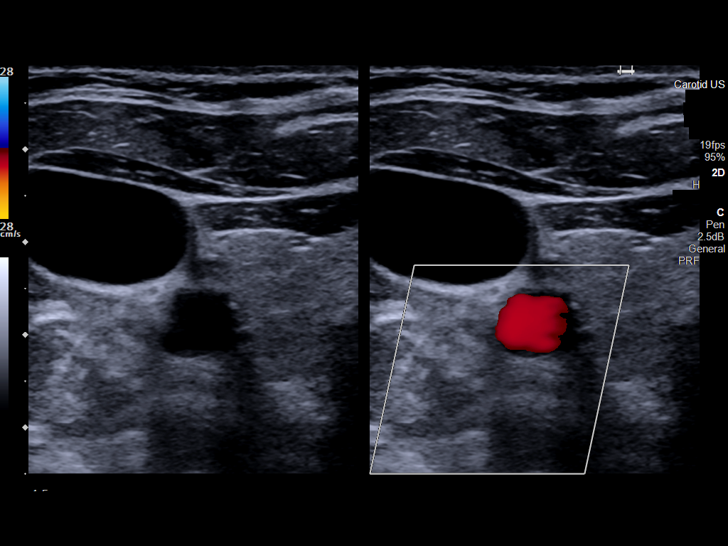
[im 6/62]
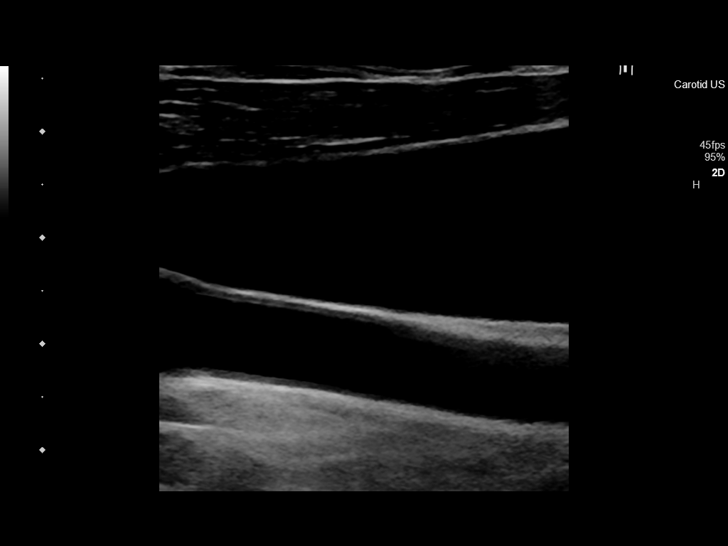
[im 11/62]
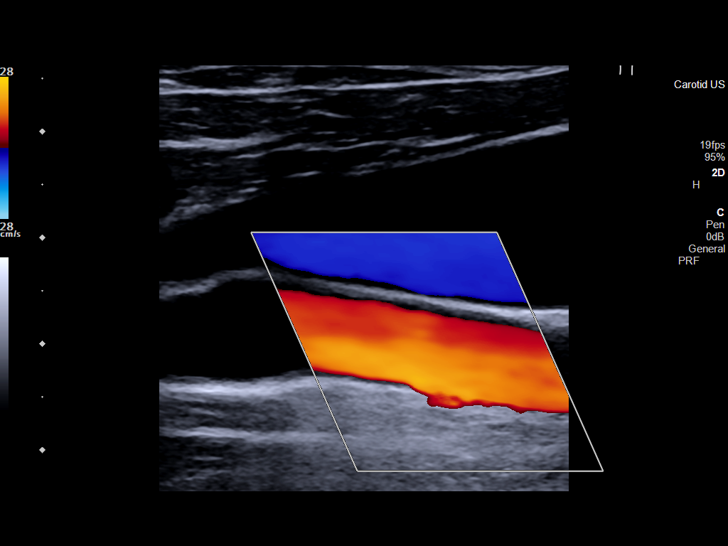
[im 16/62]
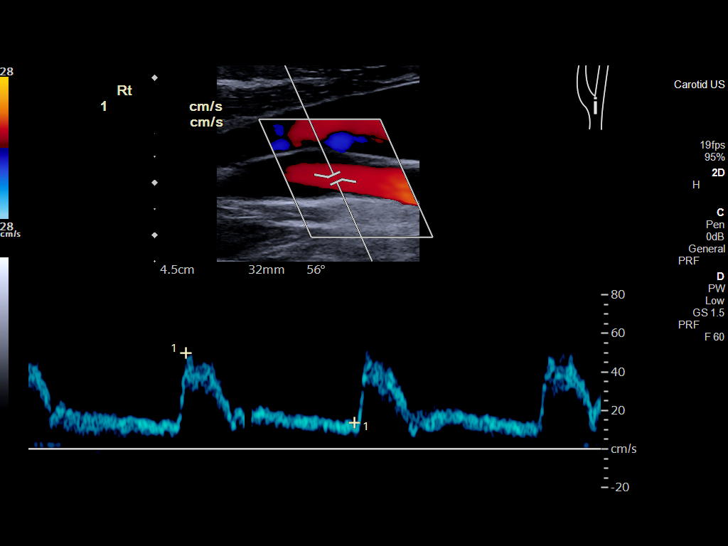
[im 22/62]
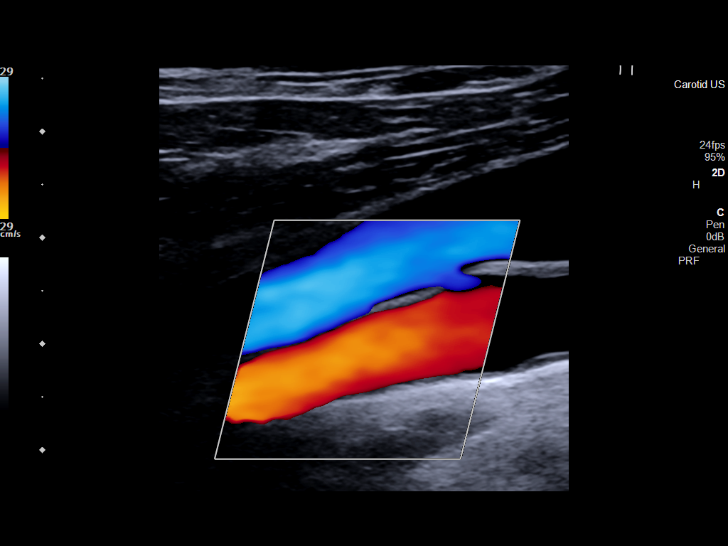
[im 27/62]
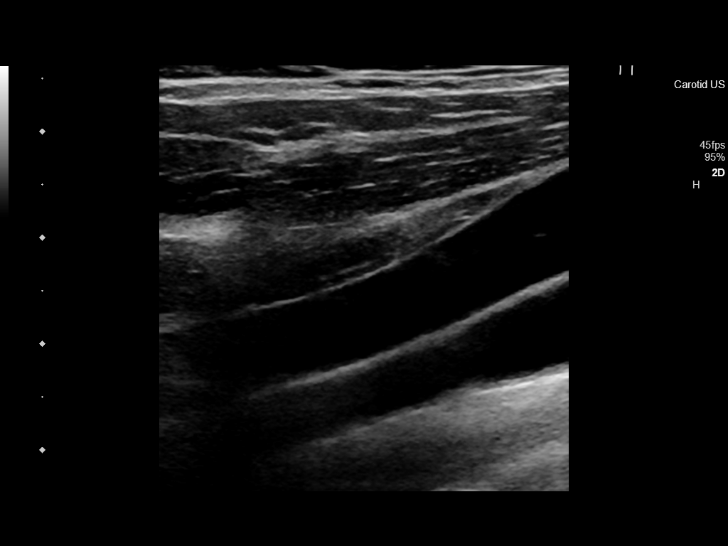
[im 32/62]
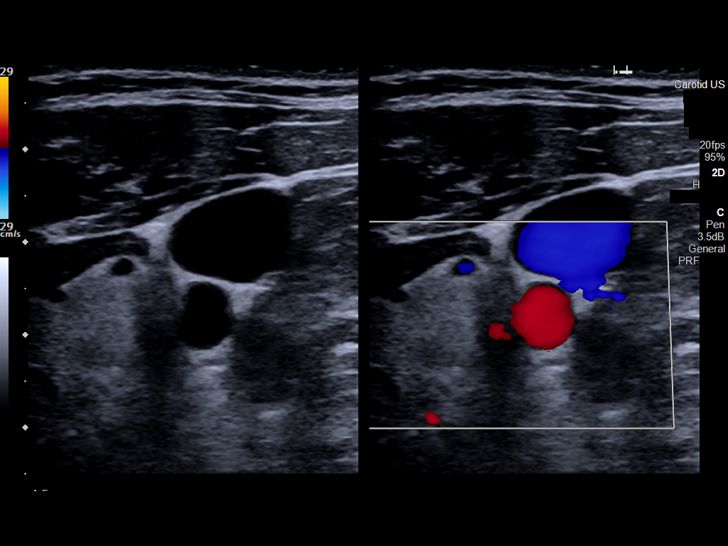
[im 35/62]
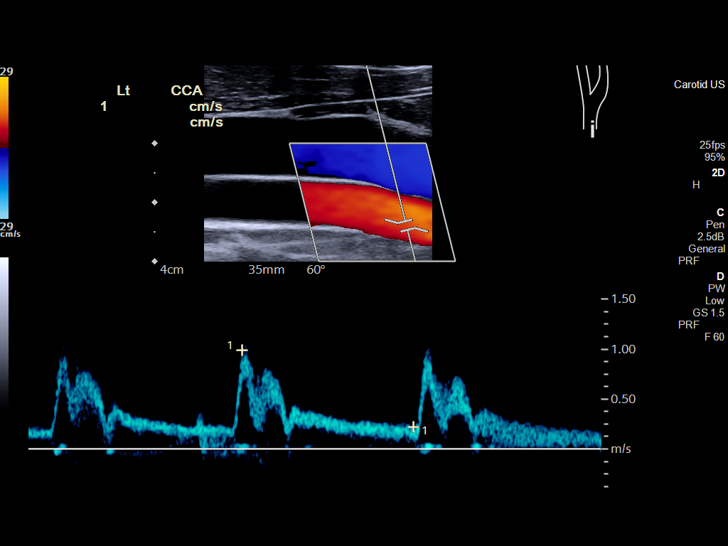
[im 40/62]
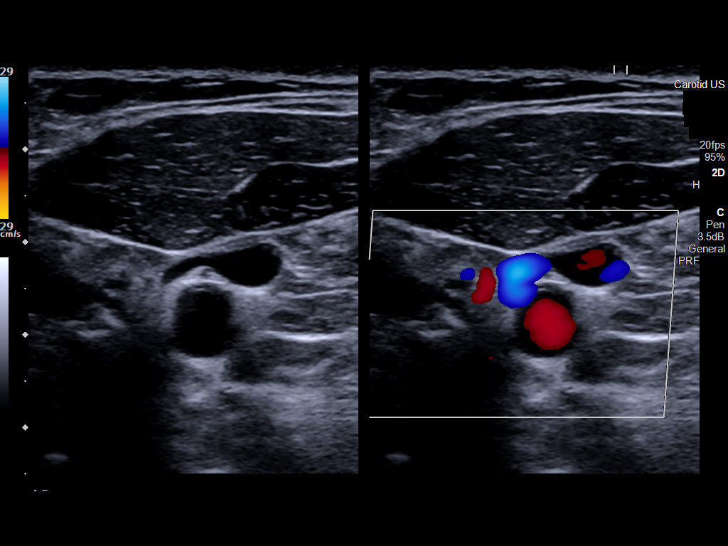
[im 46/62]
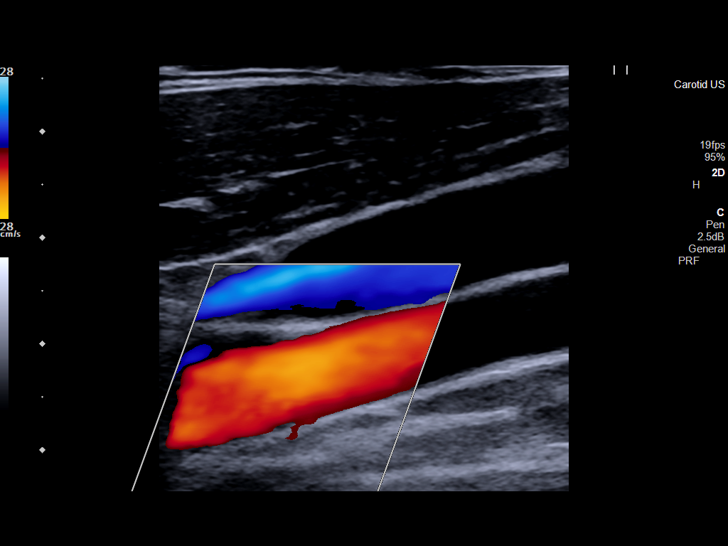
[im 51/62]
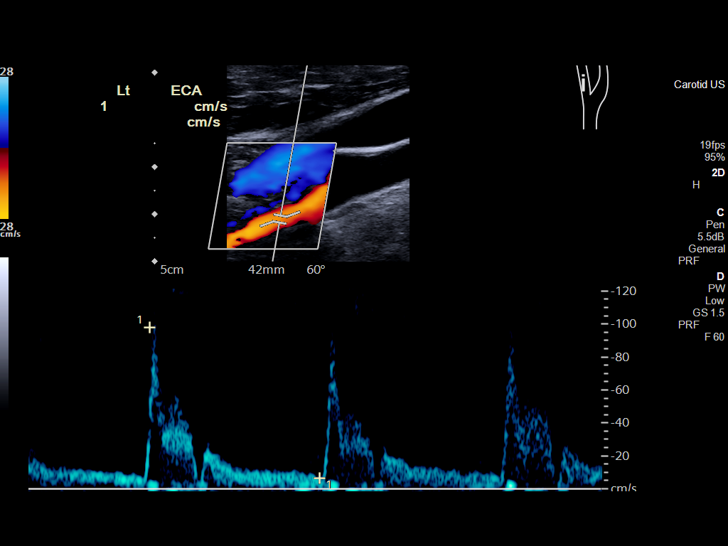
[im 56/62]
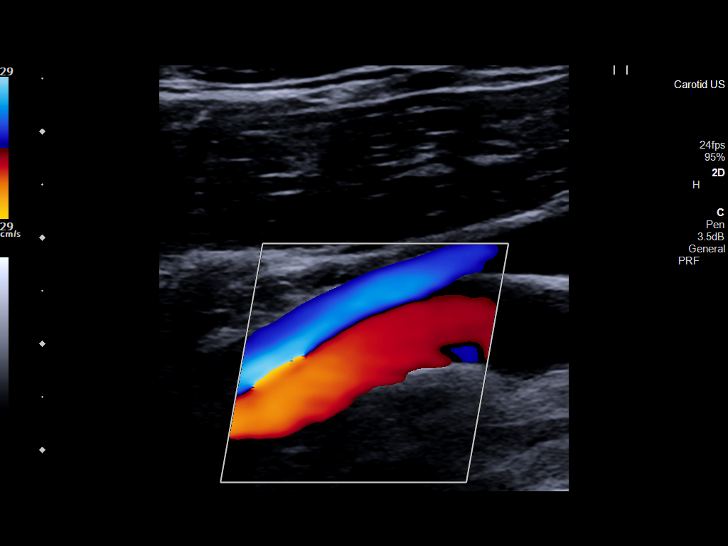
[im 62/62]
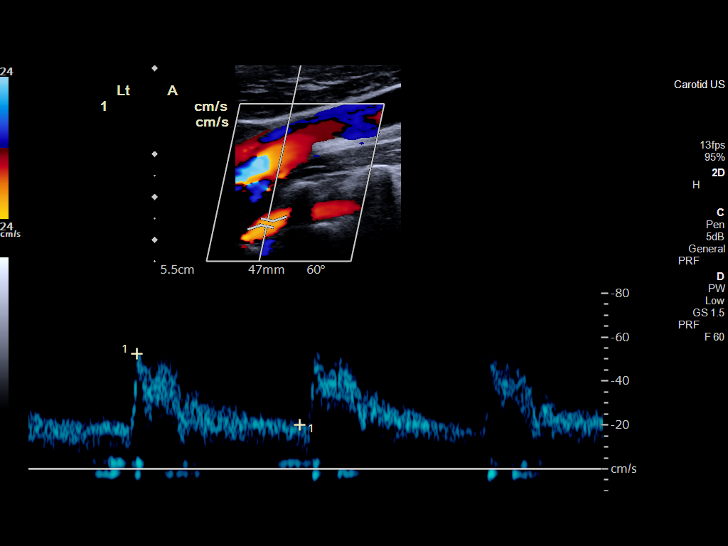

[13 of 24 positions shown; findings below may reference images not displayed]

FINDINGS: Criteria: Quantification of carotid stenosis is based on velocity
parameters that correlate the residual internal carotid diameter
with NASCET-based stenosis levels, using the diameter of the distal
internal carotid lumen as the denominator for stenosis measurement.

The following velocity measurements were obtained:

RIGHT

ICA: 72/26 cm/sec

CCA: 68/14 cm/sec

SYSTOLIC ICA/CCA RATIO:

ECA: 77 cm/sec

LEFT

ICA: 91/35 cm/sec

CCA: 99/22 cm/sec

SYSTOLIC ICA/CCA RATIO:

ECA: 98 cm/sec

RIGHT CAROTID ARTERY: Right common carotid artery widely patent
without stenosis. Minimal intimal irregularity and thickening about
the right carotid bulb without significant stenosis. Visualized
right ICA widely patent distally.

RIGHT VERTEBRAL ARTERY:  Patent with antegrade flow.

LEFT CAROTID ARTERY: Visualized left CCA widely patent without
stenosis. Minimal intimal thickening about the left carotid bulb
without stenosis. Visualized left ICA widely patent distally.

LEFT VERTEBRAL ARTERY:  Patent with antegrade flow.
IMPRESSION: 1. Negative carotid Doppler ultrasound. Minimal intimal thickening
about the carotid bulbs without hemodynamically significant
stenosis.
2. Patent vertebral arteries with antegrade flow.

## 2022-12-23 IMAGING — US US EXTREM LOW VENOUS
1 series · 13 of 24 positions shown · non-contrast
Comparison: None.

CLINICAL DATA: Bilateral leg edema and pain.



[Series 1: us extrem low venous · 0.08mm/px · 13 of 76 slices shown]
[im 1/76]
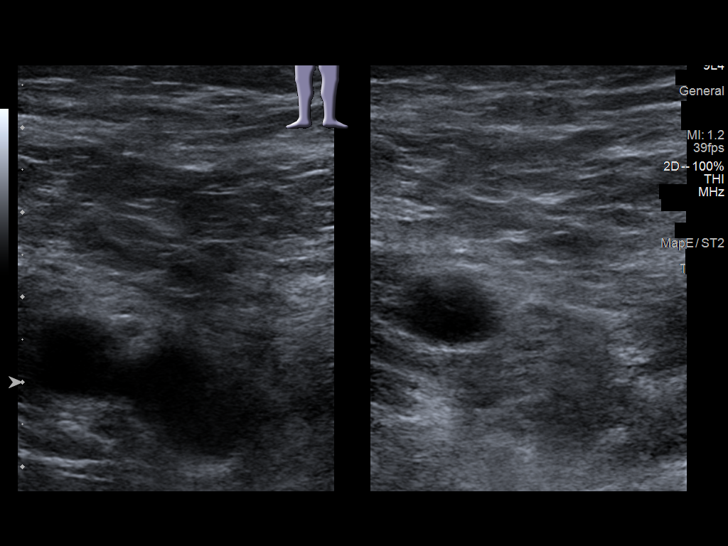
[im 7/76]
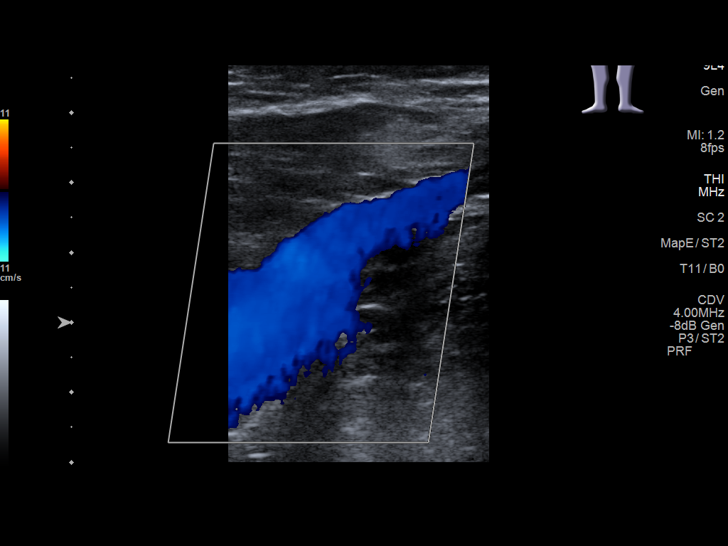
[im 14/76]
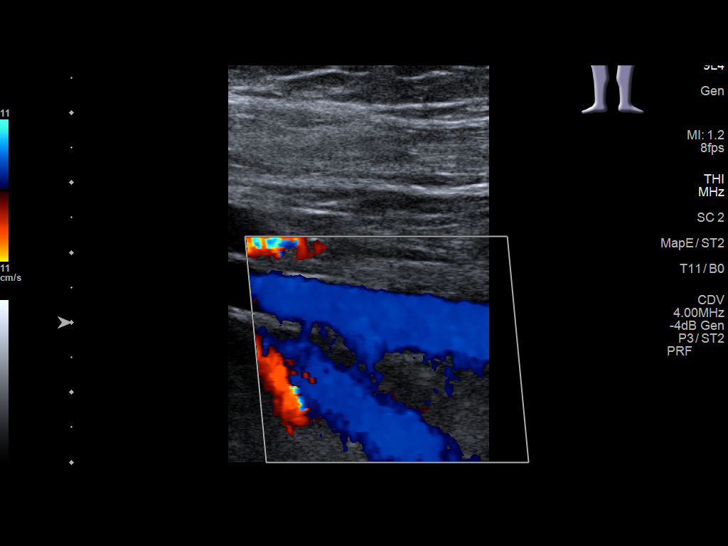
[im 20/76]
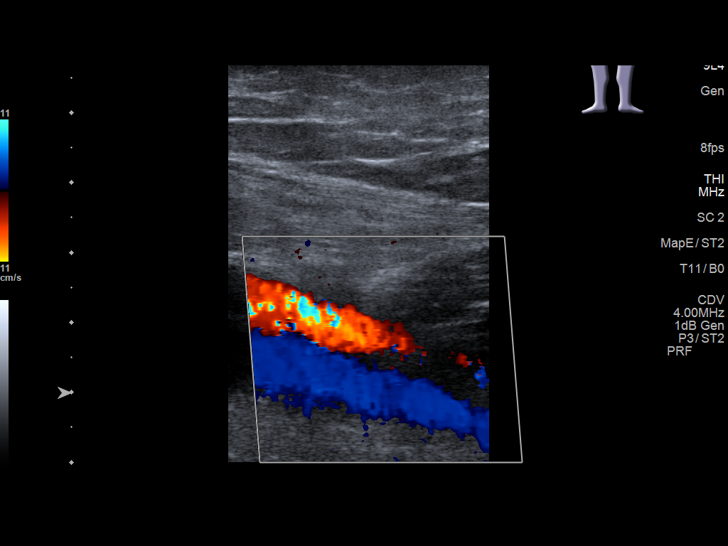
[im 27/76]
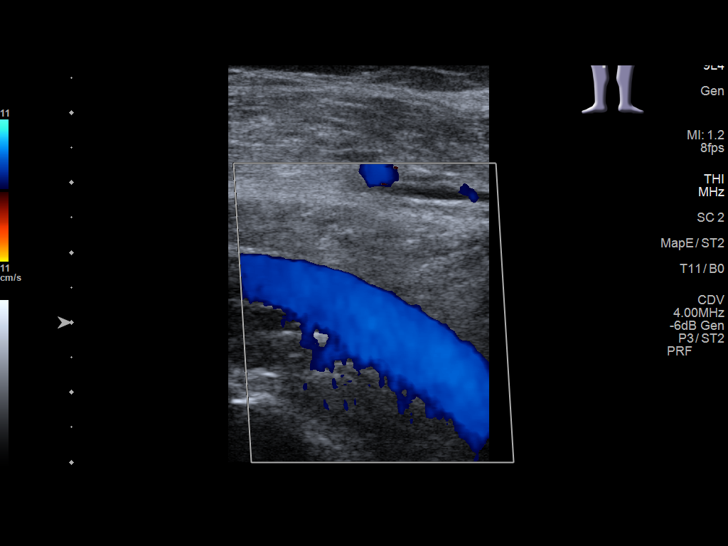
[im 33/76]
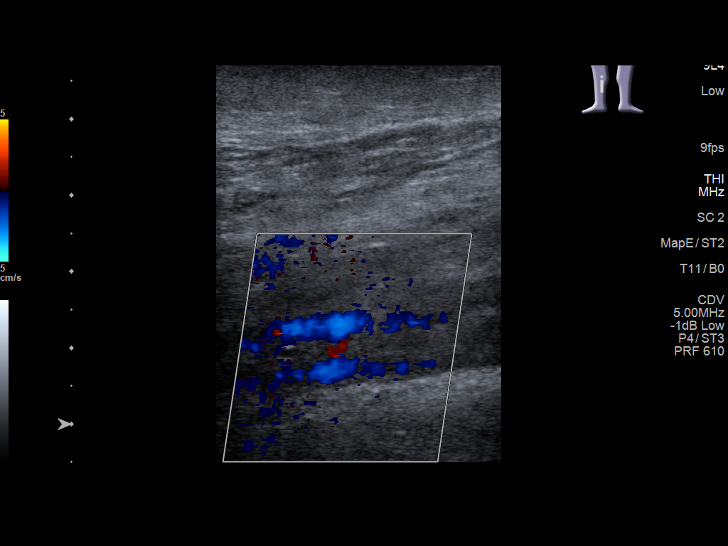
[im 40/76]
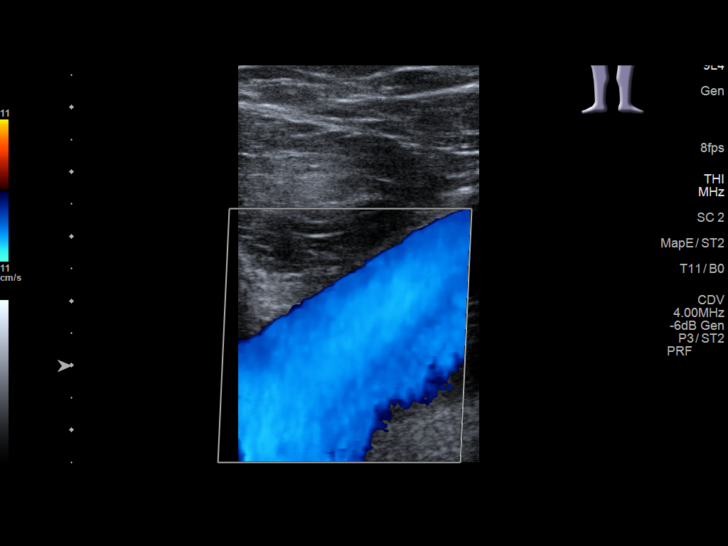
[im 43/76]
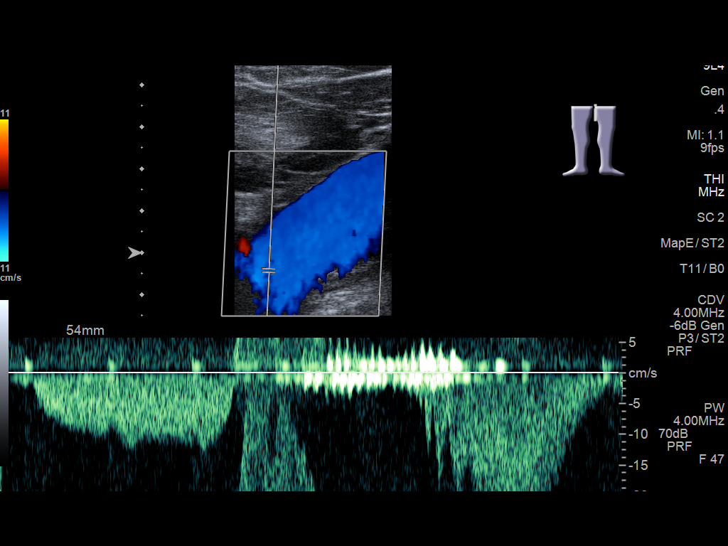
[im 49/76]
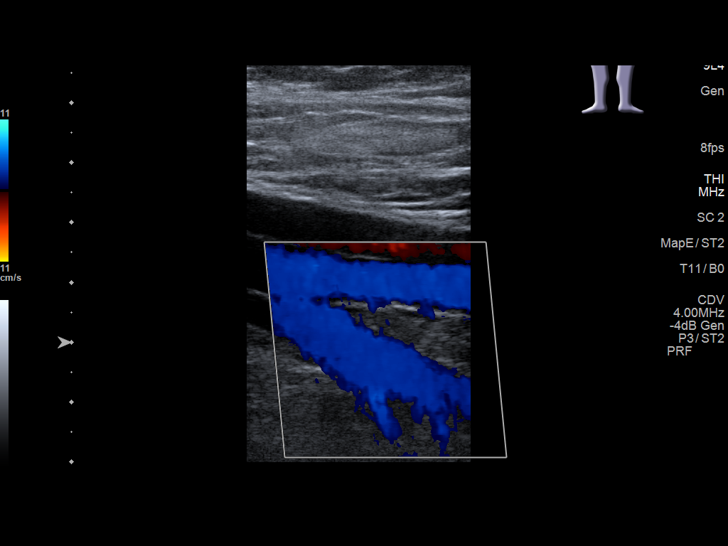
[im 56/76]
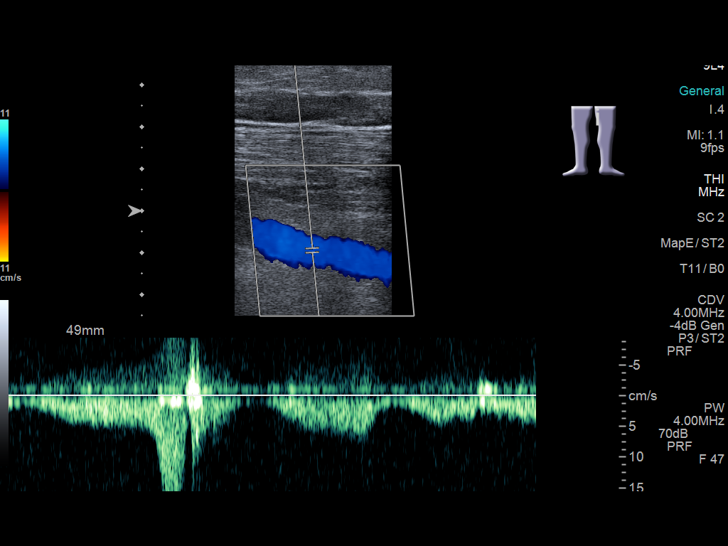
[im 62/76]
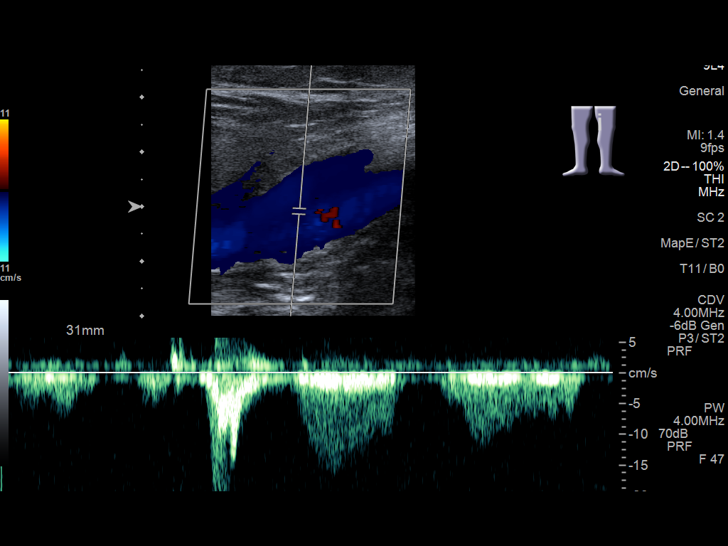
[im 69/76]
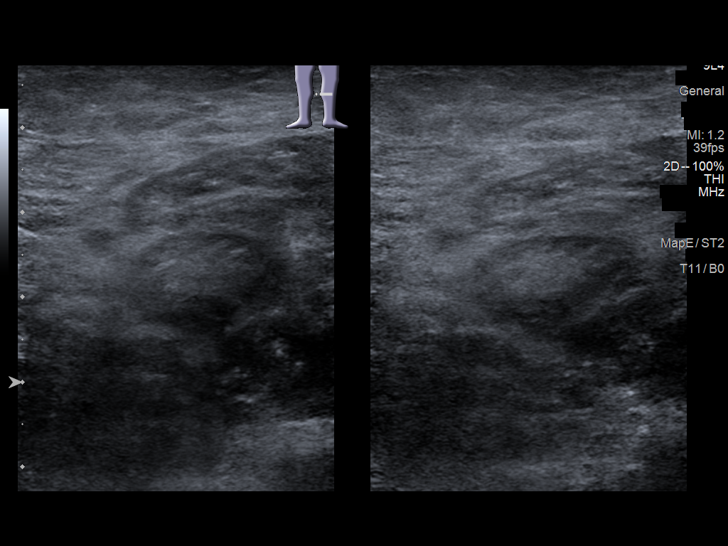
[im 76/76]
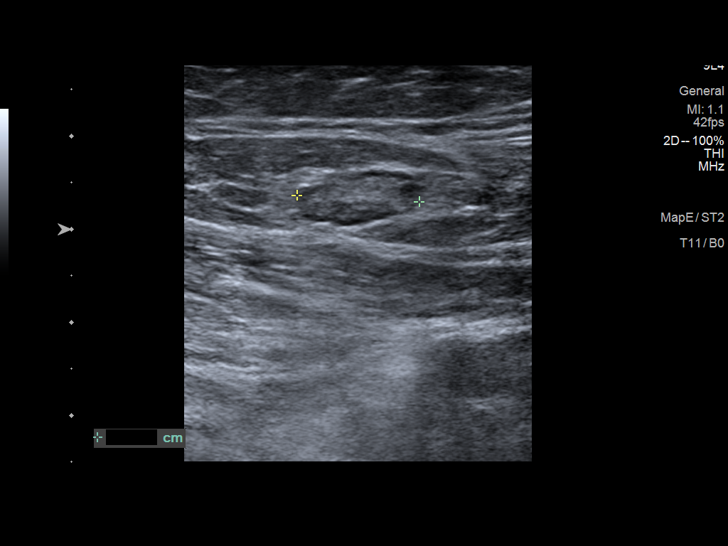

[13 of 24 positions shown; findings below may reference images not displayed]

FINDINGS: RIGHT LOWER EXTREMITY

Common Femoral Vein: No evidence of thrombus. Normal
compressibility, respiratory phasicity and response to augmentation.

Saphenofemoral Junction: No evidence of thrombus. Normal
compressibility and flow on color Doppler imaging.

Profunda Femoral Vein: No evidence of thrombus. Normal
compressibility and flow on color Doppler imaging.

Femoral Vein: No evidence of thrombus. Normal compressibility,
respiratory phasicity and response to augmentation.

Popliteal Vein: No evidence of thrombus. Normal compressibility,
respiratory phasicity and response to augmentation.

Calf Veins: No evidence of thrombus. Normal compressibility and flow
on color Doppler imaging.

Superficial Great Saphenous Vein: No evidence of thrombus. Normal
compressibility.

Venous Reflux:  None.

Other Findings: Incidental subcentimeter short axis lymph node in
the groin with normal morphology.

LEFT LOWER EXTREMITY

Common Femoral Vein: No evidence of thrombus. Normal
compressibility, respiratory phasicity and response to augmentation.

Saphenofemoral Junction: No evidence of thrombus. Normal
compressibility and flow on color Doppler imaging.

Profunda Femoral Vein: No evidence of thrombus. Normal
compressibility and flow on color Doppler imaging.

Femoral Vein: No evidence of thrombus. Normal compressibility,
respiratory phasicity and response to augmentation.

Popliteal Vein: No evidence of thrombus. Normal compressibility,
respiratory phasicity and response to augmentation.

Calf Veins: No evidence of thrombus. Normal compressibility and flow
on color Doppler imaging.

Superficial Great Saphenous Vein: No evidence of thrombus. Normal
compressibility.

Venous Reflux:  None.

Other Findings: Incidental subcentimeter short axis lymph node in
the groin with normal morphology.
IMPRESSION: No evidence of deep venous thrombosis in either lower extremity.

## 2023-09-22 ENCOUNTER — Institutional Professional Consult (permissible substitution)
Admit: 2023-09-22 | Discharge: 2023-09-22 | Payer: BLUE CROSS/BLUE SHIELD | Attending: Neurology | Primary: Student in an Organized Health Care Education/Training Program

## 2023-09-22 DIAGNOSIS — G43719 Chronic migraine without aura, intractable, without status migrainosus: Secondary | ICD-10-CM

## 2023-09-22 NOTE — Progress Notes (Signed)
52 y.o. male with chronic intractable migraine here for BTX therapy evaluation.    The patient followed closely with Dr. Elige Radon Robottom in [place]. [place]  I reviewed migraine treatment history.  He was last injected with BOTOX in May 2024 and is overdue.  He has noted migraines return as expected.  He gets approximately 80% or more improvement with BTX injections.    MIGRAINE HX:  Onset: Age 39.  Frequency: Daily or every other day.  HA free days/month: < 15 days.  Duration: > 4 hrs  Failed abortive tx:  - Sumatriptan  - Ubrelvy.  Failed prophylactic tx:  - Topiramate  - Propranolol  - Metoprolol  - Amitriptyline  - Nortriptyline  - Qulipta  ALL above medications were tried for > 60 days per patient report.    PRE-BTX baseline Migraine Disability Assessment (MIDAS):  1. Missed work days in last 3 months = 0  2. 50% reduced productivity days in last 3 months = 30  3. Household work days lost in last 3 months = 0  4. 50% reduced household work days in last 3 months = 30  5. Missed family, social, leisure days in last 3 months = 0  Total = 60.    Current Outpatient Medications   Medication Instructions    aspirin (ASPIR-81 ORAL)     atorvastatin (LIPITOR) 40 mg, oral, Daily    metoprolol succinate 100 mg capsule,sprinkle,ER 24hr     MULTIVITAMIN ORAL oral     Visit Vitals  BP (!) 143/85 (BP Location: Right arm, Patient Position: Sitting, BP Cuff Size: Adult)   Pulse 54   Ht 1.803 m   Wt 110.2 kg   BMI 33.89 kg/m   BSA 2.35 m     Review of Systems:    10 point systems reviewed and negative except: Neurological = as per HPI.    Physical examination    Neurological examination essential normal/nonfocal.    Assessment     Diagnosis Plan   1. Chronic migraine without aura, intractable, without status migrainosus (CMS-HCC)          52 y.o. male with chronic, intractable migraine here to establish care for BTX injections.  He was previously receiving injections in [place], [place] but has been overdue ever since moving back to  [place] in May 2024.  He describes significant improvement for BTX, upwards of 80%.  He has tried and failed numerous standard and conventional prophylaxis therapies as outlined in the HPI including beta-blockers, tricyclic antidepressants and antiseizure medications.  He continues to have less than 15 HA free days per month, HA duration more than 4 hours.  He is significantly impaired by migraine, MIDAS 60.  He is an excellent candidate for botulinum toxin therapy and given he is overdue, we should try to resume as soon as possible.    I reviewed the potential risks and benefits of injection with botulinum toxin for the patient's condition. Potential side effects include weakness of the injected muscle(s) including risk of dysphagia, and pain, infection or bleeding at the injection site. We reviewed the time course of response to injections and need for re-injection no frequently than every three months. The patient has provided consent to proceed with injections.    Corrugator = 2 sites, 5U each = 10 U  Procerus = 1 site, 5U each = 5 U  Frontalis = 4 sites, 5U each = 20 U  Temporalis b/l = 8 sites, 5U each = 40 U  Occipitalis  b/l = 6 sites, 5U each = 30 U  Cervical paraspinal b/l = 4 sites, 5U each = 20 U  Trapezius b/l = 6 sites, 5U each = 30 U    Total = 155 U    Plan    Resume BTX as above.    RTC for BTX asap    Toma Erichsen H. Allena Napoleon, MD.  Neurology, Movement Disorders specialist.  [facility].

## 2023-09-22 NOTE — Telephone Encounter (Signed)
Patient needs btx

## 2023-09-24 NOTE — Telephone Encounter (Signed)
Submitted PA btx.

## 2023-10-02 NOTE — Telephone Encounter (Signed)
Patient called to check on auth status.

## 2023-10-07 NOTE — Telephone Encounter (Signed)
Spoke with patient let him know botox was denied and Dr.Bashir needs to review

## 2023-10-09 ENCOUNTER — Encounter
Payer: BLUE CROSS/BLUE SHIELD | Attending: Neurology | Primary: Student in an Organized Health Care Education/Training Program

## 2023-11-05 ENCOUNTER — Ambulatory Visit
Admit: 2023-11-05 | Discharge: 2023-11-05 | Payer: BLUE CROSS/BLUE SHIELD | Attending: Neurology | Primary: Student in an Organized Health Care Education/Training Program

## 2023-11-05 DIAGNOSIS — G43719 Chronic migraine without aura, intractable, without status migrainosus: Secondary | ICD-10-CM

## 2023-11-05 MED ORDER — onabotulinumtoxinA (Botox) injection 200 Units
200 | Freq: Once | INTRAMUSCULAR | Status: AC
Start: 2023-11-05 — End: 2023-11-05
  Administered 2023-11-05: 20:00:00 155 [IU] via INTRAMUSCULAR

## 2023-11-05 MED FILL — ONABOTULINUMTOXINA 200 UNIT SOLUTION FOR INJECTION: 200 200 unit | INTRAMUSCULAR | Qty: 200

## 2023-11-05 NOTE — Progress Notes (Signed)
52 y.o. male with chronic intractable migraine - here for BTX injections.    These are his initial injections in my office.  Previously followed closely with Dr. Elige Radon Robottom in [place]. [place]  He was last injected with BOTOX in May 2024 and is overdue.  He gets approximately 80% or more improvement with BTX injections.    MIGRAINE HX:  Onset: Age 1.  Frequency: Daily or every other day.  HA free days/month: < 15 days.  Duration: > 4 hrs  Failed abortive tx:  - Sumatriptan  - Ubrelvy.  Failed prophylactic tx:  - Topiramate  - Propranolol  - Metoprolol  - Amitriptyline  - Nortriptyline  - Qulipta  ALL above medications were tried for > 60 days per patient report.    PRE-BTX baseline Migraine Disability Assessment (MIDAS):  1. Missed work days in last 3 months = 0  2. 50% reduced productivity days in last 3 months = 30  3. Household work days lost in last 3 months = 0  4. 50% reduced household work days in last 3 months = 30  5. Missed family, social, leisure days in last 3 months = 0  Total = 60.    Current Outpatient Medications   Medication Instructions    aspirin (ASPIR-81 ORAL)     atorvastatin (LIPITOR) 40 mg, oral, Daily    metoprolol succinate 100 mg capsule,sprinkle,ER 24hr     MULTIVITAMIN ORAL oral     There were no vitals taken for this visit.    Review of Systems:    10 point systems reviewed and negative except: Neurological = as per HPI.    Physical examination    Neurological examination essential normal/nonfocal.    Assessment     Diagnosis Plan   1. Chronic migraine without aura, intractable, without status migrainosus (CMS-HCC)          52 y.o. male with chronic intractable migraine - here for BTX injections.    These are his initial injections in my office.  Previously followed closely with Dr. Elige Radon Robottom in [place]. [place]  He was last injected with BOTOX in May 2024 and is overdue.  He gets approximately 80% or more improvement with BTX injections.    Procedures    I reviewed the potential  risks and benefits of injection with botulinum toxin for the patient's condition. Potential side effects include weakness of the injected muscle(s), and pain, infection or bleeding at the injection site. We reviewed the time course of response to injections and need for re-injection no frequently than every three months. The patient has provided consent to proceed with injections today.    The skin was prepped with alcohol. Using sterile technique, the following amounts of BTX suspended in 5 U/0.1 ml normal saline dilution were administered.    BTX injections per standard migraine protocol.  Corrugator = 2 sites, 5U each = 10 U  Procerus = 1 site, 5U each = 5 U  Frontalis = 4 sites, 5U each = 20 U  Temporalis b/l = 8 sites, 5U each = 40 U  Occipitalis b/l = 6 sites, 5U each = 30 U  Cervical paraspinal b/l = 4 sites, 5U each = 20 U  Trapezius b/l = 6 sites, 5U each = 30 U    Total = 155 units  45 units of unavoidable wastage.    The patient tolerated the procedure well.  There were no immediate complications.    Plan    Resume BTX  as above.    RTC in 6 weeks.    Shannen Vernon H. Allena Napoleon, MD.  Neurology, Movement Disorders specialist.  [facility].

## 2023-12-30 ENCOUNTER — Encounter
Payer: BLUE CROSS/BLUE SHIELD | Attending: Neurology | Primary: Student in an Organized Health Care Education/Training Program

## 2024-02-18 ENCOUNTER — Ambulatory Visit
Admit: 2024-02-18 | Discharge: 2024-02-18 | Payer: PRIVATE HEALTH INSURANCE | Attending: Neurology | Primary: Student in an Organized Health Care Education/Training Program

## 2024-02-18 DIAGNOSIS — G43719 Chronic migraine without aura, intractable, without status migrainosus: Secondary | ICD-10-CM

## 2024-02-18 MED ORDER — onabotulinumtoxinA (Botox) injection 200 Units
200 | Freq: Once | INTRAMUSCULAR | Status: AC
Start: 2024-02-18 — End: 2024-02-18
  Administered 2024-02-18: 18:00:00 200 [IU] via INTRAMUSCULAR

## 2024-02-18 MED FILL — ONABOTULINUMTOXINA 200 UNIT SOLUTION FOR INJECTION: 200 200 unit | INTRAMUSCULAR | Qty: 200

## 2024-02-18 NOTE — Progress Notes (Signed)
 53 y.o. male with chronic intractable migraine - here for BTX injections.    Overall, positive response to most recent BTX injections on 11/05/23.  However, he only felt partial benefit.  No adverse effects.  He did not come for his 6 week f/up so is here 1 month overdue.    MIGRAINE HX:  Onset: Age 45.  Frequency: Daily or every other day.  HA free days/month: < 15 days.  Duration: > 4 hrs  Failed abortive tx:  - Sumatriptan  - Ubrelvy.  Failed prophylactic tx:  - Topiramate  - Propranolol  - Metoprolol  - Amitriptyline  - Nortriptyline  - Qulipta  ALL above medications were tried for > 60 days per patient report.    PRE-BTX baseline Migraine Disability Assessment (MIDAS):  1. Missed work days in last 3 months = 0  2. 50% reduced productivity days in last 3 months = 30  3. Household work days lost in last 3 months = 0  4. 50% reduced household work days in last 3 months = 30  5. Missed family, social, leisure days in last 3 months = 0  Total = 60.    Current Outpatient Medications   Medication Instructions    aspirin (ASPIR-81 ORAL)     atorvastatin (LIPITOR) 40 mg, oral, Daily    metoprolol succinate 100 mg capsule,sprinkle,ER 24hr     MULTIVITAMIN ORAL oral     There were no vitals taken for this visit.    Review of Systems:    10 point systems reviewed and negative except: Neurological = as per HPI.    Physical examination    Neurological examination essential normal/nonfocal.    Migraine Disability Assessment (MIDAS):  1. Missed work days in last 3 months = 0  2. 50% reduced productivity days in last 3 months = 20  3. Household work days lost in last 3 months = 0  4. 50% reduced household work days in last 3 months = 20  5. Missed family, social, leisure days in last 3 months = 0  Total = 40    Assessment     Diagnosis Plan   1. Chronic migraine without aura, intractable, without status migrainosus          53 y.o. male with chronic intractable migraine here for BTX injections.  Overall, positive response to  most recent BTX injections on 11/05/23.  However, he only felt partial benefit.  No adverse effects.  He did not come for his 6 week f/up so is here 1 month overdue.  MIDAS has improved to 40 from baseline 60.  Ongoing BTX injections are medically necessary.    He did not come for his 6 week f/up so is here 1 month overdue.   We will try another session today but also meet in 6 weeks to review med options.    Procedures    I reviewed the potential risks and benefits of injection with botulinum toxin for the patient's condition. Potential side effects include weakness of the injected muscle(s), and pain, infection or bleeding at the injection site. We reviewed the time course of response to injections and need for re-injection no frequently than every three months. The patient has provided consent to proceed with injections today.    The skin was prepped with alcohol. Using sterile technique, the following amounts of BTX suspended in 5 U/0.1 ml normal saline dilution were administered.    BTX injections per standard migraine protocol.  Corrugator = 2  sites, 5U each = 10 U  Procerus = 1 site, 5U each = 5 U  Frontalis = 4 sites, 5U each = 20 U  Temporalis b/l = 8 sites, 5U each = 40 U  Occipitalis b/l = 6 sites, 5U each = 30 U  Cervical paraspinal b/l = 4 sites, 5U each = 20 U  Trapezius b/l = 6 sites, 5U each = 30 U    Total = 155 units  45 units of unavoidable wastage.    The patient tolerated the procedure well.  There were no immediate complications.    Plan    BTX as above.    RTC in 6 weeks.    Caitlinn Klinker H. Allena Napoleon, MD.  Neurology, Movement Disorders specialist.  [facility].

## 2024-04-02 ENCOUNTER — Ambulatory Visit
Admit: 2024-04-02 | Discharge: 2024-04-02 | Payer: PRIVATE HEALTH INSURANCE | Attending: Neurology | Primary: Student in an Organized Health Care Education/Training Program

## 2024-04-02 DIAGNOSIS — G43719 Chronic migraine without aura, intractable, without status migrainosus: Secondary | ICD-10-CM

## 2024-04-02 MED ORDER — galcanezumab (Emgality) 120 mg/mL pen-injector
120 | Freq: Once | SUBCUTANEOUS | 0 refills | 28.00000 days | Status: AC
Start: 2024-04-02 — End: 2024-04-02

## 2024-04-02 MED ORDER — galcanezumab (Emgality) 120 mg/mL pen-injector
120 | SUBCUTANEOUS | 11 refills | 28.00000 days | Status: DC
Start: 2024-04-02 — End: 2024-04-12

## 2024-04-02 NOTE — Progress Notes (Signed)
 53 y.o. male with chronic intractable migraine - here for BTX f/up.    No benefit from injections on 02/18/24.  He has quit his job, "I can't work".  No AE with injections.    MIGRAINE HX:  Previously following Dr. Dany Dyke in [place].  Onset: Age 28.  Frequency: Daily or every other day.  HA free days/month: < 15 days.  Duration: > 4 hrs  Failed abortive tx:  - Sumatriptan  - Ubrelvy.  Failed prophylactic tx:  - Topiramate  - Propranolol  - Metoprolol  - Verapamil  - Amitriptyline  - Nortriptyline  - Qulipta  - BOTOX  - XEOMIN  - Ajovy (2 cycles)  ALL above medications were tried for > 60 days per patient report.  MRI brain was normal in Jan 2023.  He uses CPAP regularly for OSA.    Baseline Migraine Disability Assessment (MIDAS):  1. Missed work days in last 3 months = 0  2. 50% reduced productivity days in last 3 months = 30  3. Household work days lost in last 3 months = 0  4. 50% reduced household work days in last 3 months = 30  5. Missed family, social, leisure days in last 3 months = 0  Total = 60.    Current Outpatient Medications   Medication Instructions    amLODIPine (NORVASC) 10 mg, Daily    aspirin (ASPIR-81 ORAL)     atorvastatin (LIPITOR) 40 mg, Daily    metoprolol succinate 100 mg capsule,sprinkle,ER 24hr     MULTIVITAMIN ORAL Take by mouth.     Visit Vitals  BP (!) 144/92 (BP Location: Left arm, Patient Position: Sitting, BP Cuff Size: Adult)   Pulse 64   Ht 1.778 m   Wt 116.6 kg   BMI 36.88 kg/m   BSA 2.4 m     Review of Systems:    10 point systems reviewed and negative except: Neurological = as per HPI.    Physical examination    Neurological examination essential normal/nonfocal.    Assessment     Diagnosis Plan   1. Chronic migraine without aura, intractable, without status migrainosus           53 y.o. male with chronic intractable migraine here for BTX f/up.    Now derive no benefit from 2 sessions of BTX unfortunately.  He has tried and failed numerous oral and injectable prophylaxis options.   He continues to have a daily headache.  Potential remaining options include Emgality, Aimovig, older oral options such as lisinopril or candesartan.  We could also consider Nurtec.    He has 2 glasses of white wine nightly.  I have advised him to consider discontinuation to see if this helps.  His sleep apnea is treated.  He has no significant stress/depression/anxiety.  He is overweight so I advised him to work on weight loss.  All in an effort to help reduce possible triggers.    Potential AE's of Emgality reviewed include injection site reactions.      Plan    Emgality 240 loading dose, 120 then monthly thereafter.  Consider Nurtec, candesartan, lisinopril, VYPETI.    RTC in 3 months.    Mable Lashley H. Janyth Meres, MD.  Neurology, Movement Disorders specialist.  [facility].

## 2024-04-12 MED ORDER — galcanezumab (Emgality) 120 mg/mL pen-injector
120 | SUBCUTANEOUS | 11 refills | 28.00000 days | Status: AC
Start: 2024-04-12 — End: 2025-04-12

## 2024-04-12 NOTE — Telephone Encounter (Signed)
 Can [facility] re [facility], I have updated it in patients chart. His insurance requires it to go through their preferred mail order pharmacy.

## 2024-04-21 MED ORDER — galcanezumab (Emgality) 120 mg/mL pen-injector
120 | Freq: Once | SUBCUTANEOUS | 0 refills | 28.00000 days | Status: AC
Start: 2024-04-21 — End: 2024-04-21

## 2024-04-21 MED ORDER — galcanezumab (Emgality) 120 mg/mL pen-injector
120 | Freq: Once | SUBCUTANEOUS | 0 refills | 28.00000 days | Status: DC
Start: 2024-04-21 — End: 2024-04-21

## 2024-04-21 NOTE — Telephone Encounter (Signed)
 Can you send one time rx for Loading dose of emgality, the pharmacy needs a separate one time rx for the loading dose

## 2024-04-21 NOTE — Addendum Note (Signed)
 Addended by: Marinda Si on: 04/21/2024 01:39 PM     Modules accepted: Orders

## 2024-07-08 ENCOUNTER — Encounter
Payer: PRIVATE HEALTH INSURANCE | Attending: Neurology | Primary: Student in an Organized Health Care Education/Training Program

## 2024-11-18 ENCOUNTER — Ambulatory Visit
Admit: 2024-11-18 | Discharge: 2024-11-18 | Payer: PRIVATE HEALTH INSURANCE | Attending: Neurology | Primary: Student in an Organized Health Care Education/Training Program

## 2024-11-18 MED ORDER — NURTEC ODT 75 MG DISINTEGRATING TABLET
75 | ORAL_TABLET | ORAL | 11 refills | 30.00000 days | Status: AC
Start: 2024-11-18 — End: 2025-11-18

## 2024-11-18 NOTE — Progress Notes (Signed)
 53 y.o. male with chronic intractable migraine.    Initial visit 09/2023 = plan BTX.  F/up 03/2024 = failed 2 rounds of BTX, try Emgality .    Since last visit:  - He is here alone.  - No benefit from Emgality .  -  -    No benefit from injections on 02/18/24.  He has quit his job, I can't work.  No AE with injections.    MIGRAINE HX:  Previously following Dr. Heath in [place].  Onset: Age 48.  Frequency: Daily or every other day.  HA free days/month: < 15 days.  Duration: > 4 hrs  Failed abortive tx:  - Sumatriptan  - Ubrelvy.  Failed prophylactic tx:  - Topiramate  - Propranolol  - Metoprolol  - Verapamil  - Amitriptyline  - Nortriptyline  - Qulipta  - BOTOX   - XEOMIN  - Ajovy (2 cycles)  - Emgality .  ALL above medications were tried for > 60 days per patient report.  MRI brain was normal in Jan 2023.  He uses CPAP regularly for OSA.    Baseline Migraine Disability Assessment (MIDAS):  1. Missed work days in last 3 months = 0  2. 50% reduced productivity days in last 3 months = 30  3. Household work days lost in last 3 months = 0  4. 50% reduced household work days in last 3 months = 30  5. Missed family, social, leisure days in last 3 months = 0  Total = 60.    Current Outpatient Medications   Medication Instructions    amLODIPine (NORVASC) 10 mg, Daily    aspirin (ASPIR-81 ORAL)     atorvastatin (LIPITOR) 40 mg, Daily    galcanezumab  (EMGALITY ) 120 mg, subcutaneous, Every 30 days    metoprolol succinate 100 mg capsule,sprinkle,ER 24hr     MULTIVITAMIN ORAL Take by mouth.     Visit Vitals  Ht 1.778 m   Wt 116.6 kg   BMI 36.88 kg/m   BSA 2.4 m     Review of Systems:    10 point systems reviewed and negative except: Neurological = as per HPI.    Physical examination    Neurological examination essential normal/nonfocal.    Assessment     Diagnosis Plan   1. Chronic migraine without aura, intractable, without status migrainosus  rimegepant (Nurtec ODT ) 75 mg tablet,disintegrating        53 y.o. male with chronic  intractable migraine here for f/up.    Unfortunately, his intractable migraines continue despite the use of Emgality .  He has taken at least 5 doses without any change in migraine pattern.  Our sessions of BTX prior to Emgality  also failed which is unfortunate given while living in [place] , he had received BTX therapy for years with at least 80% relief.  As listed in the HPI, he has tried and failed an extensive list of preventive agents.  Options that remain include Nurtec, Aimovig, candesartan, lisinopril or Vypeti.  Will try Nurtec.    He is applying for benefits through his lawyer.  Paperwork filled out to this effect.    More than 50% of this visit was spent counseling: discussing diagnosis and prognosis, reviewing the treatment plan and coordinating patient care. Total visit time = 25 minutes.    Plan    Discontinue Emgality .  Start Nurtec 75 mg every other day.  Consider Aimovig, VYPETI or revisiting BTX.    RTC in 3 months.    Jin Shockley H. Ulysess, MD.  Neurology, Movement Disorders specialist.  [facility].
